# Patient Record
Sex: Male | Born: 1950 | Race: White | Hispanic: No | Marital: Married | Smoking: Former smoker
Health system: Southern US, Community
[De-identification: ages and names within clinical notes are randomized; demographics above are authoritative.]

## PROBLEM LIST (undated history)

## (undated) DIAGNOSIS — N200 Calculus of kidney: Secondary | ICD-10-CM

## (undated) DIAGNOSIS — M1990 Unspecified osteoarthritis, unspecified site: Secondary | ICD-10-CM

## (undated) DIAGNOSIS — K5792 Diverticulitis of intestine, part unspecified, without perforation or abscess without bleeding: Secondary | ICD-10-CM

## (undated) HISTORY — PX: BACK SURGERY: SHX140

## (undated) HISTORY — PX: JOINT REPLACEMENT: SHX530

---

## 1987-10-29 HISTORY — PX: CHOLECYSTECTOMY: SHX55

## 2001-03-04 ENCOUNTER — Encounter (INDEPENDENT_AMBULATORY_CARE_PROVIDER_SITE_OTHER): Payer: Self-pay | Admitting: *Deleted

## 2001-03-04 ENCOUNTER — Ambulatory Visit (HOSPITAL_COMMUNITY): Admission: RE | Admit: 2001-03-04 | Discharge: 2001-03-04 | Payer: Self-pay | Admitting: Gastroenterology

## 2001-04-29 ENCOUNTER — Emergency Department (HOSPITAL_COMMUNITY): Admission: EM | Admit: 2001-04-29 | Discharge: 2001-04-29 | Payer: Self-pay | Admitting: Emergency Medicine

## 2001-04-29 ENCOUNTER — Encounter: Payer: Self-pay | Admitting: Emergency Medicine

## 2001-05-08 ENCOUNTER — Other Ambulatory Visit: Admission: RE | Admit: 2001-05-08 | Discharge: 2001-05-08 | Payer: Self-pay | Admitting: Family Medicine

## 2001-10-20 ENCOUNTER — Encounter: Admission: RE | Admit: 2001-10-20 | Discharge: 2001-10-20 | Payer: Self-pay | Admitting: Orthopedic Surgery

## 2001-10-20 ENCOUNTER — Encounter: Payer: Self-pay | Admitting: Orthopedic Surgery

## 2001-11-23 ENCOUNTER — Encounter: Payer: Self-pay | Admitting: Orthopedic Surgery

## 2001-11-30 ENCOUNTER — Inpatient Hospital Stay (HOSPITAL_COMMUNITY): Admission: RE | Admit: 2001-11-30 | Discharge: 2001-12-06 | Payer: Self-pay | Admitting: Orthopedic Surgery

## 2001-11-30 ENCOUNTER — Encounter: Payer: Self-pay | Admitting: Orthopedic Surgery

## 2004-04-06 ENCOUNTER — Ambulatory Visit (HOSPITAL_COMMUNITY): Admission: RE | Admit: 2004-04-06 | Discharge: 2004-04-06 | Payer: Self-pay | Admitting: *Deleted

## 2004-04-06 ENCOUNTER — Encounter (INDEPENDENT_AMBULATORY_CARE_PROVIDER_SITE_OTHER): Payer: Self-pay | Admitting: *Deleted

## 2010-01-11 ENCOUNTER — Encounter: Admission: RE | Admit: 2010-01-11 | Discharge: 2010-01-11 | Payer: Self-pay | Admitting: Family Medicine

## 2011-03-15 NOTE — Discharge Summary (Signed)
[facility]  Patient:    Roy Becker, Roy Becker Visit Number: [id number] MRN: [id number]          Service Type: SUR Location: [id number] Attending Physician:  Loanne Drilling Dictated by:   Ottie Glazier Wynona Neat, P.A.-C. Admit Date:  11/30/2001 Discharge Date: 12/06/2001                             Discharge Summary  ADMITTING DIAGNOSES: 1. Status post right total hip arthroplasty, now with loosening components. 2. Depression. 3. Psoriasis. 4. History of kidney stone. 5. History of alcoholism with cessation in 1994. 6. History of benign essential tremor.  DISCHARGE DIAGNOSES: 1. Status post right total hip arthroplasty, now with loosening components,    improved. 2. Depression. 3. Psoriasis. 4. History of kidney stone. 5. History of alcoholism with cessation in 1994. 6. History of benign essential tremor.  PROCEDURE:  Right total hip revision per Ollen Gross, M.D., with Georges Lynch. Darrelyn Hillock, M.D., assisting.  CONSULTATIONS:  None.  HISTORY OF PRESENT ILLNESS:  Mr. Metayer is a 60 year old white male who was seen and evaluated at [facility] for ongoing right hip pain for approximately six months.  The patient is status post right total hip arthroplasty in 1989 with subsequent revision in 1997.  Since that time the patient had no problems; however, for the past six months the patient had complained of persistent, ongoing pain.  Diagnostic studies revealed that the patient had a loosening of the right hip component.  Due to these findings, a decision was made to proceed with surgical intervention per Dr. Ollen Gross. The risks and benefits have been discussed with the patient in great detail prior to entering the operating suite.  LABORATORY DATA:  X-rays showed loosening of the prosthesis.  Bone scan was performed and showed loosening of the femoral prosthesis as well.  HOSPITAL COURSE:  On November 29, 2001, the patient  underwent a right total hip revision by Dr. Lequita Halt with Dr. Darrelyn Hillock assisting.  Please see operative note for details.  Postoperative day #1 the patient was awake, he was alert.  He was in moderate pain.  He was utilizing a PCA.  He also complained of a sore throat postoperatively.  His vital signs were stable, he was afebrile.  His H&H was 13.1 and 36.8, his potassium was 5.2.  The following day patient was resting in bed stating that he felt better, his pain was moderately controlled, and that his throat was feeling better after the advent of Cepacol.  The patient was concerned about his Foley being removed too early, as he had had problems in the past postoperatively with urinary retention after the Foley was discontinued.  His vital signs were stable, he was afebrile.  His H&H was 10.9 and 30.4.  PT and INR were 30.3 and 4.1.  Right hip dressings were removed, and the incision was clean, dry, and intact during its removal without complications.  The incision was without erythema or discharge.  The patients motor and neurovascular functions were intact. Physical therapy will be initiated today.  He will be out of bed into a chair and on touchdown weightbearing.  Flomax was initiated at 0.4 mg p.o. q.d., and he will have his Foley discontinued later on today with orders to I&O catheterize x3 if needed with reinsertion of the Foley after this.  It was noted on December 02, 2001, OT was attempted; however, the patient  declined, saying he was too tired and would prefer to wait until tomorrow.  His anticoagulation therapy was monitored closely by pharmacy; however, on December 03, 2001, there was a large jump in the INR to 7.1; therefore, the Coumadin was withheld until an appropriate INR was obtained.  Examination of the right hip revealed that there was mild serosanguineous drainage in the distal incision, otherwise no signs or symptoms of infection.  He had 2+ dorsalis pedis pulses, and his  motor and sensory functions were intact.  The patient was placed on vitamin K as well.  On December 04, 2001, postoperative day #4, the patient was seen up in bed watching TV, had just finished eating breakfast.  Unfortunately, he stated he had to have his catheter reinserted. Otherwise, he felt good and had no other complaints.  His PT and INR were 22 and 2.4 this day.  Vital signs were stable, he was afebrile.  Examination of the right hip showed that there was still a mild amount of serosanguineous discharge noted on the dressing.   His _____ was intact, skin was intact. Motor and neurovascular functions were intact.  A decision made that if patient was unable to void during his hospital day without being catheterized, that we would initiate bladder training.  He had an I&O catheter for use until this resolved, as well as follow-up with urology.  The patient did continue Flomax. The following day the patient said that he was able to urinate on his own.  He had felt tired and fatigued and states that he could "feel his heart beating fast," otherwise no complaints.  No nausea or vomiting, fevers, or chills.  Blood pressure was 122/58, pulse was 106, respirations 16.  He was afebrile.  His PT and INR were 22.4 and 2.4.  His H&H was noted to be 8.8 and 25.  His admit H&H were 14.3 and 40.8.  Due to decrease of greater than 3 in his hemoglobin as well as the symptomatology, the decision made to transfuse the patient two units.  The following day the patient felt better, had more energy.  H&H was 10.5 and 30.5.  His vital signs were stable, he was afebrile. The incision was clean, dry, and intact.  Due to his improved condition, the decision was made to discharge him home.  FINAL DIAGNOSES: 1. Status post right total hip revision. 2. Postoperative anemia, resolved. 3. Urinary retention, resolved.  CONDITION ON DISCHARGE:  Improved.  DISCHARGE INSTRUCTIONS:  Diet is regular.  Activity:  He  is to keep his dressings clean, dry, and intact and change daily.  Touchdown weightbearing. Move feet and toes frequently.  [facility] will be following  the patient postoperatively for his physical therapy needs as well as his anticoagulation therapy.  DISCHARGE MEDICATIONS: 1. Percocet 5 mg one to two p.o. q.4-6h. p.r.n. pain, #40. 2. Robaxin 500 mg one p.o. q.6-8h. p.r.n. spasm, #40. 3. Flomax 0.4 mg one p.o. q.d., #10 of those.  FOLLOW-UP:  He is to follow up with Dr. Lequita Halt in 17 days and call [phone] for appointment, questions, or concerns. Dictated by:   Ottie Glazier. Wynona Neat, P.A.-C. Attending Physician:  Loanne Drilling DD:  01/06/02 TD:  01/08/02 Job: [id number]

## 2011-03-15 NOTE — Procedures (Signed)
[place]  Patient:    Roy Becker, Roy Becker.                   MRN: [id number] Proc. Date: 03/04/01 Attending:  Verlin Grills, M.D. CC:         Bing Neighbors. Tenny Craw, M.D.   Procedure Report  DATE OF BIRTH:  12-31-1950  REFERRING PHYSICIAN:  Dr. Judithann Sauger.  PROCEDURE PERFORMED:  Colonoscopy.  ENDOSCOPIST:  Verlin Grills, M.D.  INDICATIONS FOR PROCEDURE:  The patient is a 60 year old male referred for diagnostic colonoscopy to evaluate hemoccult positive stool.  Mr. Tukes was treated for possible diverticulitis December 20, 2000.  His stool was hemoccult positive by exam.  After completing a course of antibiotics, his pain resolved.  I discussed with the patient the complications associated with colonoscopy and polypectomy including intestinal bleeding and intestinal perforation.  The patient has signed the operative permit.  ALLERGIES:  IODINE.  PAST MEDICAL HISTORY:  Remote back surgery.  Bilateral hip replacement surgery.  Remote cholecystectomy.  Gastroesophageal reflux disease.  Recently treated acute diverticulitis.  PREMEDICATION:  Fentanyl 50 mcg, Versed 10 mg.  ENDOSCOPE:  Olympus pediatric video colonoscope.  DESCRIPTION OF PROCEDURE:  After obtaining informed consent, the patient was placed in the left lateral decubitus position.  I administered intravenous fentanyl and intravenous Versed to achieve conscious sedation for the procedure.  The patients blood pressure, oxygen saturation and cardiac rhythm were monitored throughout the procedure and documented in the medical record.  Anal inspection was normal.  Digital rectal examination revealed a non-nodular prostate.  The Olympus pediatric video colonoscope was then introduced into the rectum and under direct vision, advanced to the cecum.  Colonic preparation for the exam today was excellent.  Rectum:  Normal.  Sigmoid colon and descending colon:  At  approximately 50 cm from the anal verge a 0.5 mm sessile polyp was removed with the cold biopsy forceps.  I did not detect colonic diverticula and there was no endoscopic evidence for the presence of acute diverticulitis or inflammatory bowel disease.  Splenic flexure:  Normal.  Transverse colon:  Normal.  Hepatic flexure:  Normal.  Ascending colon:  Normal.  Cecum and ileocecal valve:  Normal.  ASSESSMENT:  Normal proctocolonoscopy to the cecum except for the removal of a 0.5 mm sessile polyp from the descending colon at approximately 50 cm from the anal verge.  RECOMMENDATIONS:  If the descending colon polyp returns adenomatous pathologically, Mr. Bunda should undergo a repeat colonoscopy in five years. If the descending colon polyp in non-neoplastic, Mr. Pembleton should undergo a repeat colonoscopy in 10 years. DD:  03/04/01 TD:  03/04/01 Job: [id number]

## 2011-03-15 NOTE — H&P (Signed)
[facility]  Patient:    Roy Becker, Roy Becker. Visit Number: [id number] MRN: [id number]          Service Type: Attending:  Ollen Gross, M.D. Dictated by:   Dorie Rank, P.A. Adm. Date:  11/30/01   CC:         C.A. Tenny Craw, M.D., [facility]   History and Physical  DATE OF BIRTH:  1951-04-17  CHIEF COMPLAINT:  Right hip pain.  HISTORY OF PRESENT ILLNESS:  Roy Becker is a pleasant 60 year old male, who has had ongoing right hip pain for about six months now.  He had a right total knee arthroplasty originally in 1989 with a subsequent revision in 1997, and has had no problems with it since the past six months.  X-rays and a bone scan revealed increased activity to the proximal and distal portions of the right femur.  It was felt due to his ongoing pain and diagnostic studies, he would need a right total hip arthroplasty revision.  Risks and benefits as well as procedure were discussed with the patient.  He agreed to proceed.  He obtained medical clearance from his family medical doctor, Dr. Tenny Craw.  MEDICATIONS: 1. Temazepam 15 mg one p.o. q.h.s. p.r.n. 2. Metoprolol 50 mg one p.o. b.i.d. 3. Wellbutrin SR 150 mg 1-2 p.o. q.d.  ALLERGIES:  EGGS causes facial edema.  RADIOGRAPHIC IV CONTRAST causes anaphylaxis.  PAST MEDICAL HISTORY: 1. Significant for osteoarthritis. 2. History of alcoholism.  He quit drinking in 1994 and has had no trouble    since then. 3. He has a history of kidney stones. 4. He has history of depression related to his sons recent death. 5. He had a right forearm fracture in 1994. 6. History of psoriasis. 7. History of benign essential tremor which he uses metoprolol for.  FAMILY HISTORY:  Son deceased with leukemia.  Mother living, 34, history of chronic leukemia.  Father living, age 64, history of cardiovascular disease.  SURGICAL HISTORY: 1. In 1989 and 1990 he had bilateral total hip  arthroplasty.  He had    subsequent revisions in 1997 to both hips. 2. Cholecystectomy in 1989. 3. Lumbar laminectomy in 1990.  SOCIAL HISTORY:  The patient is married.  He quit drinking in 1994.  He quit smoking in 1997.  REVIEW OF SYMPTOMS:  GENERAL:  No fevers, chills, night sweats, or bleeding tendencies.  PULMONARY:  No shortness of breath, productive cough, or hemoptysis.  CARDIOVASCULAR:  No chest pain, angina, or orthopnea.  ENDOCRINE: No history of diabetes mellitus, hypo or hyperthyroidism.  GENITOURINARY:  No dysuria, hematuria, or discharge.  GASTROINTESTINAL:  No nausea, vomiting, diarrhea, melena, or constipation.  NEUROLOGIC:  No seizures, headaches, or paralysis.  No diplopia.  PHYSICAL EXAMINATION:  GENERAL:  Alert and oriented 60 year old male, well-developed, well-nourished.  VITAL SIGNS:  Pulse 70, respirations 18, blood pressure 115/85.  HEENT:  Head is atraumatic, normocephalic.  Oropharynx is clear.  NECK:  Supple, negative for carotid bruits bilaterally.  Negative for cervical lymphadenopathy palpated on exam.  CHEST:  Lungs clear to auscultation bilaterally.  No wheezes, rhonchi, or rales.  BREASTS:  Not pertinent to present illness.  HEART:  S1, S2, negative for murmur, rub, or gallop.  Heart has a regular rate and rhythm.  ABDOMEN:  Round, nontender, positive bowel sounds.  GENITOURINARY:  Not pertinent to present illness.  EXTREMITIES:  Distal pulses are intact.  He walks with an antalgic abductor lurch.  He walks with  a cane.  He has pain with range of motion.  SKIN:  Intact.  He has psoriasis to his left lower extremity.  LABORATORY:  Preoperative labs and x-rays are pending.  Positive bone scan/x-rays revealed loosening of the femoral prosthesis.  IMPRESSION: 1. Loosening of total hip arthroplasty. 2. Depression. 3. Psoriasis. 4. History of kidney stone. 5. History of alcoholism, quit in 1994. 6. Benign essential tremor.  PLAN:   The patient is scheduled for a right total hip arthroplasty, _________ with Dr. Ollen Gross.Dictated by:   Dorie Rank, P.A. Attending:  Ollen Gross, M.D. DD:  11/23/01 TD:  11/23/01 Job: [id number]

## 2011-03-15 NOTE — Op Note (Signed)
[facility]  Patient:    Roy Becker Visit Number: [id number] MRN: [id number]          Service Type: Attending:  Ollen Gross, M.D. Dictated by:   Ollen Gross, M.D. Proc. Date: 11/30/01                             Operative Report  PREOPERATIVE DIAGNOSIS:  Loose right total hip arthroplasty.  POSTOPERATIVE DIAGNOSIS:  Loose right total hip arthroplasty.  PROCEDURE:  Right total hip arthroplasty revision.  SURGEON:  Ollen Gross, M.D.  ASSISTANT:  Georges Lynch. Darrelyn Hillock, M.D.  ANESTHESIA:  General.  ESTIMATED BLOOD LOSS:  1350  REPLACEMENT:  1 liter of cell saver.  DRAINS:  Hemovac x1.  COMPLICATIONS:  None.  CONDITION:  Stable to recovery.  BRIEF CLINICAL NOTE:  Roy Becker is a 60 year old male who had a revision right total hip done approximately 6-7 years ago and has had significant thigh pain for the past year. He also had evidence of asymmetric polyethylene wear with some lysis in his proximal femur. Bone scan demonstrated increased uptake and plain films demonstrated subsiding of the femoral stem. He presents now for femoral revision with acetabular liner revision secondary to loosening and osteolysis.  DESCRIPTION OF PROCEDURE:  After successful administration of general anesthetic, the patient was placed in the left lateral decubitus position with the right side up and held with the hip positioner. The right lower extremity was isolated from the perineum with plastic drapes and prepped and draped in the usual sterile fashion. A standard posterolateral incision was made, a long incision with a 10 blade through the subcutaneous tissue to the level of the fascia lata which was incised in line with the incision. Once we got down to the level of the pseudocapsule, a small incision was made and a tremendous amount of osteolytic material within the joint. This debris was all removed. The sciatic nerve had been palpated and protected prior  to this and removed enough debris so I would be able to dislocate the hip. There is definitely toggling of the stem consistent with a loose stem. The 26 mm femoral head is removed from the femoral neck. We obtained proximal exposure and disrupted the interface between prosthesis and bone proximally. I was then able to easily extract the stem. It was an osteonic Omniflex stem. The ______ hip on the distal part of the stem did not come out. Several attempts were made with the extraction instruments  to remove this but I could not remove it. We also reamed up to 18 mm proximal to this and it would not come. We thus subsequently made a cortical window after exposing the lateral cortex of the femur at the appropriate level. Windows removed and I was able to remove that tip. I prophylactically placed a cable distal to the window to prevent splitting once the stem was driven across. We then replaced the window and cable back.  I then reamed over a guidewire to a total of 18.5 mm in the femoral canal. The proximal reaming was performed to a 99F and the sleeve was machined to a large. The 99F large trial was placed with a 22 x 17 stem and first the calcar replace in the neck. The calcar replaced in the neck left them way too long instrument. The 36 plus 8 neck which left them at the appropriate length. We did a trial 26 head because a 26 liner just  for soft tissue tensioning and it was excellent. We removed all the trials and then removed the acetabular liner which had severe polyethylene wear. The metal shell was stable. We then placed a permanent 28 mm 10 degree liner into the osteonics cup with the high wall being at the 10 oclock position. We then did a trial again, this time with a 28 plus 0 head and there was some soft tissue laxity so we went to a 28 plus 6 head and there was great stability with full extension and full external rotation. 70 degrees flexion and 40 degrees adduction, 90  degrees internal rotation and 90 degrees flexion, and 70 degrees internal rotation. Took all trials out. The permanent 26F large sleeve was impacted into the proximal femur and then 22 x 17 S-ROM long stem with a 36 plus 8 neck is impacted. The anteversion ______ was approximately 20-25 degrees. The permanent 28 plus 6 head was placed, reduction performed and there was great stability throughout. We checked through the previous cortical window and a stem went across that easily with no split distal to it. I took a lateral x-ray which showed that the stem is in the femoral canal and that the clothespin of the stem had closed down consistent with a very tight fit. At this point, all the wounds had copiously irrigated with antibiotic solution and the fascia over the vastus lateralis closed with a running #1 Vicryl, fascia lata closed over a Hemovac drain with interrupted #1 Vicryl, subcu closed with #1 and 2-0 Vicryl and skin with staples. The skin was cleaned and dried and Steri-Strips and a bulky sterile dressing applied. The patient was awakened and transported to recovery in stable condition. Dictated by:   Ollen Gross, M.D. Attending:  Ollen Gross, M.D. DD:  11/30/01 TD:  11/30/01 Job: [id number]

## 2015-03-25 ENCOUNTER — Emergency Department (HOSPITAL_COMMUNITY): Payer: Federal, State, Local not specified - PPO

## 2015-03-25 ENCOUNTER — Encounter (HOSPITAL_COMMUNITY): Payer: Self-pay | Admitting: Emergency Medicine

## 2015-03-25 ENCOUNTER — Emergency Department (HOSPITAL_COMMUNITY)
Admission: EM | Admit: 2015-03-25 | Discharge: 2015-03-25 | Disposition: A | Discharge status: Home / Self Care | Payer: Federal, State, Local not specified - PPO | Attending: Emergency Medicine | Admitting: Emergency Medicine

## 2015-03-25 DIAGNOSIS — N200 Calculus of kidney: Secondary | ICD-10-CM | POA: Insufficient documentation

## 2015-03-25 DIAGNOSIS — Z8719 Personal history of other diseases of the digestive system: Secondary | ICD-10-CM | POA: Diagnosis not present

## 2015-03-25 DIAGNOSIS — Z9049 Acquired absence of other specified parts of digestive tract: Secondary | ICD-10-CM | POA: Diagnosis not present

## 2015-03-25 DIAGNOSIS — Z79899 Other long term (current) drug therapy: Secondary | ICD-10-CM | POA: Diagnosis not present

## 2015-03-25 DIAGNOSIS — R109 Unspecified abdominal pain: Secondary | ICD-10-CM

## 2015-03-25 DIAGNOSIS — R1031 Right lower quadrant pain: Secondary | ICD-10-CM | POA: Diagnosis present

## 2015-03-25 HISTORY — DX: Diverticulitis of intestine, part unspecified, without perforation or abscess without bleeding: K57.92

## 2015-03-25 HISTORY — DX: Calculus of kidney: N20.0

## 2015-03-25 LAB — URINE MICROSCOPIC-ADD ON

## 2015-03-25 LAB — COMPREHENSIVE METABOLIC PANEL
ALK PHOS: 93 U/L (ref 38–126)
ALT: 27 U/L (ref 17–63)
AST: 23 U/L (ref 15–41)
Albumin: 4.3 g/dL (ref 3.5–5.0)
Anion gap: 9 (ref 5–15)
BUN: 17 mg/dL (ref 6–20)
CALCIUM: 8.7 mg/dL — AB (ref 8.9–10.3)
CHLORIDE: 104 mmol/L (ref 101–111)
CO2: 26 mmol/L (ref 22–32)
CREATININE: 1.06 mg/dL (ref 0.61–1.24)
GFR calc Af Amer: >60 mL/min (ref >60)
GFR calc non Af Amer: >60 mL/min (ref >60)
Glucose, Bld: 199 mg/dL — ABNORMAL HIGH (ref 65–99)
Potassium: 3.9 mmol/L (ref 3.5–5.1)
SODIUM: 139 mmol/L (ref 135–145)
TOTAL PROTEIN: 7.4 g/dL (ref 6.5–8.1)
Total Bilirubin: 1 mg/dL (ref 0.3–1.2)

## 2015-03-25 LAB — URINALYSIS, ROUTINE W REFLEX MICROSCOPIC
BILIRUBIN URINE: NEGATIVE
GLUCOSE, UA: 500 mg/dL — AB
Ketones, ur: NEGATIVE mg/dL
Leukocytes, UA: NEGATIVE
NITRITE: NEGATIVE
PROTEIN: NEGATIVE mg/dL
SPECIFIC GRAVITY, URINE: 1.02 (ref 1.005–1.030)
UROBILINOGEN UA: 0.2 mg/dL (ref 0.0–1.0)
pH: 7 (ref 5.0–8.0)

## 2015-03-25 LAB — CBC
HEMATOCRIT: 42.9 % (ref 39.0–52.0)
Hemoglobin: 15 g/dL (ref 13.0–17.0)
MCH: 31.3 pg (ref 26.0–34.0)
MCHC: 35 g/dL (ref 30.0–36.0)
MCV: 89.4 fL (ref 78.0–100.0)
PLATELETS: 169 10*3/uL (ref 150–400)
RBC: 4.8 MIL/uL (ref 4.22–5.81)
RDW: 12.5 % (ref 11.5–15.5)
WBC: 10.7 10*3/uL — AB (ref 4.0–10.5)

## 2015-03-25 LAB — LIPASE, BLOOD: Lipase: 22 U/L (ref 22–51)

## 2015-03-25 MED ORDER — ONDANSETRON HCL 8 MG PO TABS: 4.0000 mg | ORAL_TABLET | Freq: Four times a day (QID) | ORAL | Status: DC

## 2015-03-25 MED ORDER — SODIUM CHLORIDE 0.9 % IV SOLN
Freq: Once | INTRAVENOUS | Status: AC
  Administered 2015-03-25: 17:00:00 via INTRAVENOUS

## 2015-03-25 MED ORDER — ONDANSETRON HCL 4 MG/2ML IJ SOLN
4.0000 mg | INTRAMUSCULAR | Status: AC
  Administered 2015-03-25: 4 mg via INTRAVENOUS
  Filled 2015-03-25: qty 2

## 2015-03-25 MED ORDER — OXYCODONE-ACETAMINOPHEN 5-325 MG PO TABS: 1.0000 | ORAL_TABLET | Freq: Four times a day (QID) | ORAL | Status: DC | PRN

## 2015-03-25 MED ORDER — TRAMADOL HCL 50 MG PO TABS
50.0000 mg | ORAL_TABLET | Freq: Once | ORAL | Status: AC
  Administered 2015-03-25: 50 mg via ORAL
  Filled 2015-03-25: qty 1

## 2015-03-25 MED ORDER — MORPHINE SULFATE 2 MG/ML IJ SOLN
2.0000 mg | Freq: Once | INTRAMUSCULAR | Status: AC
  Administered 2015-03-25: 2 mg via INTRAVENOUS
  Filled 2015-03-25: qty 1

## 2015-03-25 MED ORDER — MORPHINE SULFATE 4 MG/ML IJ SOLN
2.0000 mg | INTRAMUSCULAR | Status: AC | PRN
  Administered 2015-03-25 (×2): 2 mg via INTRAVENOUS
  Filled 2015-03-25 (×2): qty 1

## 2015-03-25 MED ORDER — TAMSULOSIN HCL 0.4 MG PO CAPS: 0.4000 mg | ORAL_CAPSULE | Freq: Every day | ORAL | Status: DC

## 2015-03-25 NOTE — Discharge Instructions (Signed)

## 2015-03-25 NOTE — ED Notes (Signed)
Patient transported to X-ray 

## 2015-03-25 NOTE — ED Notes (Signed)
Pt c/o RLQ/groin pain that radiates into his "whole back."  Pt has hx of kidney stones and diverticulitis but sts that this pain feels different than both. Pt sts that since this morning he has only been able to urinate a few drops which is abnormal for him. Pt c/o nausea with one episode of vomiting. A&Ox4 and ambulatory.

## 2015-03-25 NOTE — ED Notes (Signed)
Nurse starting IV 

## 2015-03-25 NOTE — ED Provider Notes (Signed)
CSN: [id number]     Arrival date & time 03/25/15  1450 History   First MD Initiated Contact with Patient 03/25/15 1511     Chief Complaint  Patient presents with  . Abdominal Pain  . Back Pain     (Consider location/radiation/quality/duration/timing/severity/associated sxs/prior Treatment) HPI   Roy Becker is a 64 year-old male with past medical history of cholecystectomy, ETOH abuse, diverticulitis and psoriasis, who presents to the [facility] emergency department this afternoon for 1 day of achy abdominal pain, located in his right lower abdomen, which radiates to his bilateral lower back and to right scrotum.  It began last night and intermittantly increased and subsided.  The pain woke him up at 3 am, was severity rated 8/10 and he experienced associated sweating, "disorientation' and nausea. He attempted to make himself vomit to relieve the nausea, but was unable to.  He took 2 amoxicillin pills of unknown strength and a restoril -  capsul, and was able to doze back off for an hour.  From 4 am on he sat in bed, unable to get back to sleep, but the pain eased off enough that he did go to work this morning, although the pain never fully went away.  He can get some relief from the pain with positional changes laying in bed, or by completely relaxing his abdomen.  His abdomen is more distended than usual.   He previously had a kidney stone, 8 years ago, and he said that pain was sharp and this does not feel the same.  He has chronic loose bowels and last had a bowel movement this morning at work at approximately 9am, without blood or color change, and he feels the urge to have a BM but cannot.   He says during a normal day he drinks one - 20 oz mt. Dew, and does not urinate very much.  He has made very little urine since last night, and denies any burning, urgency or frequency.   Past Medical History  Diagnosis Date  . Kidney stone   . Diverticulitis    Past Surgical History  Procedure  Laterality Date  . Joint replacement    . Cholecystectomy     No family history on file. History  Substance Use Topics  . Smoking status: Not on file  . Smokeless tobacco: Not on file  . Alcohol Use: No    Review of Systems  Constitutional: Negative for fever, activity change, appetite change, fatigue and unexpected weight change.  HENT: Negative.   Eyes: Negative.   Respiratory: Negative.   Cardiovascular: Negative.   Musculoskeletal: Positive for back pain.  Skin: Negative.   Neurological: Negative.       Allergies  Contrast media and Eggs or egg-derived products  Home Medications   Prior to Admission medications   Medication Sig Start Date End Date Taking? Authorizing Provider  amitriptyline (ELAVIL) 10 MG tablet Take 10 mg by mouth daily as needed for sleep (stomach irritation).  03/10/15  Yes Historical Provider, MD  buPROPion (WELLBUTRIN XL) 300 MG 24 hr tablet Take 300 mg by mouth daily. 03/05/15  Yes Historical Provider, MD  carvedilol (COREG) 25 MG tablet Take 25 mg by mouth 2 (two) times daily with a meal.   Yes Historical Provider, MD  clobetasol cream (TEMOVATE) 0.05 % Apply 1 application topically 2 (two) times daily as needed (psoriasis).   Yes Historical Provider, MD  COSENTYX SENSOREADY PEN 150 MG/ML SOAJ Take 300 mg by mouth every 28 (twenty-eight) days.  03/21/15  Yes Historical Provider, MD  folic acid (FOLVITE) 1 MG tablet Take 1 mg by mouth daily. 03/13/15  Yes Historical Provider, MD  temazepam (RESTORIL) 15 MG capsule Take 15-30 mg by mouth at bedtime as needed. 03/14/15  Yes Historical Provider, MD  ondansetron (ZOFRAN) 8 MG tablet Take 0.5 tablets (4 mg total) by mouth every 6 (six) hours. 03/25/15   Danelle Berry, PA-C  oxyCODONE-acetaminophen (PERCOCET/ROXICET) 5-325 MG per tablet Take 1-2 tablets by mouth every 6 (six) hours as needed for severe pain. 03/25/15   Danelle Berry, PA-C  tamsulosin (FLOMAX) 0.4 MG CAPS capsule Take 1 capsule (0.4 mg total) by mouth  daily. 03/25/15   Danelle Berry, PA-C   BP 160/67 mmHg  Pulse 80  Temp(Src) 98.1 F (36.7 C) (Oral)  Resp 19  Ht  (1.778 m)  Wt 220 lb (99.791 kg)  BMI 31.57 kg/m2  SpO2 94% Physical Exam  Constitutional: He is oriented to person, place, and time. He appears well-developed and well-nourished.  HENT:  Head: Normocephalic and atraumatic.  Eyes: Conjunctivae and EOM are normal. Pupils are equal, round, and reactive to light. No scleral icterus.  Cardiovascular: Normal rate, regular rhythm and normal pulses.  Exam reveals no gallop and no friction rub.   No murmur heard. Pulmonary/Chest: Effort normal and breath sounds normal. No accessory muscle usage. No tachypnea. No respiratory distress. He has no wheezes. He has no rales. He exhibits no tenderness.  Abdominal: Soft. Bowel sounds are normal. He exhibits shifting dullness. He exhibits no distension, no pulsatile liver, no abdominal bruit, no ascites, no pulsatile midline mass and no mass. There is no hepatomegaly. There is tenderness in the right lower quadrant. There is no rigidity, no rebound, no guarding, no CVA tenderness, no tenderness at McBurney's point and negative Murphy's sign. No hernia. Hernia confirmed negative in the ventral area, confirmed negative in the right inguinal area and confirmed negative in the left inguinal area.  Genitourinary: Testes normal and penis normal. Right testis shows no mass, no swelling and no tenderness. Left testis shows no mass, no swelling and no tenderness. No penile erythema or penile tenderness. No discharge found.  Lymphadenopathy:       Right: No inguinal adenopathy present.       Left: No inguinal adenopathy present.  Neurological: He is alert and oriented to person, place, and time.  Skin: Skin is warm and dry. No rash noted. He is not diaphoretic. No erythema. No pallor.    ED Course  Procedures (including critical care time) Labs Review Labs Reviewed  URINALYSIS, ROUTINE W REFLEX  MICROSCOPIC (NOT AT [facility]) - Abnormal; Notable for the following:    Glucose, UA 500 (*)    Hgb urine dipstick MODERATE (*)    All other components within normal limits  COMPREHENSIVE METABOLIC PANEL - Abnormal; Notable for the following:    Glucose, Bld 199 (*)    Calcium 8.7 (*)    All other components within normal limits  CBC - Abnormal; Notable for the following:    WBC 10.7 (*)    All other components within normal limits  LIPASE, BLOOD  URINE MICROSCOPIC-ADD ON  HEMOGLOBIN A1C    Imaging Review Ct Abdomen Pelvis Wo Contrast  03/25/2015   CLINICAL DATA:  Right lower quadrant pain  EXAM: CT ABDOMEN AND PELVIS WITHOUT CONTRAST  TECHNIQUE: Multidetector CT imaging of the abdomen and pelvis was performed following the standard protocol without IV contrast.  COMPARISON:  01/11/2010  FINDINGS:  Lung bases are free of acute infiltrate or sizable effusion. The gallbladder has been surgically removed. The liver, spleen, adrenal glands and pancreas are within normal limits. The kidneys are well visualized bilaterally and the left kidney is well visualized and within normal limits. The right kidney demonstrates some scattered small nonobstructing calculi. The largest of these is not noted in the lower pole measuring 5 mm. Mild fullness of the collecting system and right ureter is noted secondary to a 6 mm stone in the mid right ureter. The more distal right ureter is within normal limits. The bladder is within normal limits. Mild diverticular change is noted without diverticulitis. Postsurgical changes in the hip this are noted bilaterally. The appendix is within normal limits.  IMPRESSION: Nonobstructing right renal calculi as well as a right mid ureteral stone with obstructive changes. No other focal abnormality is seen.   Electronically Signed   By: Alcide CleverMark  Lukens M.D.   On: 03/25/2015 18:58   Dg Chest 2 View  03/25/2015   CLINICAL DATA:  Abdominal/back pain  EXAM: CHEST  2 VIEW  COMPARISON:  None.   FINDINGS: Lungs are clear.  No pleural effusion or pneumothorax.  The heart is normal in size.  Visualized osseous structures are within normal limits.  IMPRESSION: Normal chest radiographs.   Electronically Signed   By: Charline BillsSriyesh  Krishnan M.D.   On: 03/25/2015 16:29   Dg Abd 1 View  03/25/2015   CLINICAL DATA:  Abdominal pain x1 day, nausea  EXAM: ABDOMEN - 1 VIEW  COMPARISON:  None.  FINDINGS: Nonobstructive bowel gas pattern.  Cholecystectomy clips.  Degenerative changes the lower lumbar spine.  Visualized bony pelvis appears intact.  Bilateral hip arthroplasties.  IMPRESSION: Unremarkable abdominal radiograph.   Electronically Signed   By: Charline BillsSriyesh  Krishnan M.D.   On: 03/25/2015 16:29     EKG Interpretation None      MDM   Final diagnoses:  Abdominal pain, acute  Nephrolithiasis   Pt had achy abdominal pain Imaging found multiple kidney stones.   Pt was notified of elevated blood sugars and advised to follow up with PCP for fasting glucose testing. Prescribed medications were reviewed, tamsulosin SE of orthostatic hypotension was explained and pt was advised to take at night when going to bed to avoid SE.   PO trial successful, one dose of oral tramadol was ordered and pt vitals were reviewed and within normal limits.  Filed Vitals:   03/25/15 1940  BP: 160/67  Pulse: 80  Temp: 98.1 F (36.7 C)  Resp: 19   He was given a strainer, and follow up instruction, return precautions, and was discharged home   Danelle BerryLeisa Tomio Kirk, PA-C 03/27/15 0325  Roy DibblesJon Knapp, MD 03/29/15 1441

## 2015-03-25 NOTE — ED Notes (Signed)
Gave patient a few strainers and a urinal cup for the kidney stones.

## 2015-03-25 NOTE — ED Notes (Signed)
Patient was given water and did not have any difficulty drinking the water. Patient did not feel nausea's and did not vomit.

## 2015-03-28 LAB — HEMOGLOBIN A1C
HEMOGLOBIN A1C: 6.5 % — AB (ref 4.8–5.6)
Mean Plasma Glucose: 140 mg/dL

## 2015-04-06 ENCOUNTER — Telehealth: Payer: Self-pay | Admitting: Hematology

## 2015-07-24 ENCOUNTER — Other Ambulatory Visit (HOSPITAL_COMMUNITY): Payer: Self-pay | Admitting: Orthopedic Surgery

## 2015-07-24 DIAGNOSIS — Z96642 Presence of left artificial hip joint: Secondary | ICD-10-CM

## 2015-08-04 ENCOUNTER — Encounter (HOSPITAL_COMMUNITY)
Admission: RE | Admit: 2015-08-04 | Discharge: 2015-08-04 | Disposition: A | Discharge status: Home / Self Care | Payer: Federal, State, Local not specified - PPO | Source: Ambulatory Visit | Attending: Orthopedic Surgery | Admitting: Orthopedic Surgery

## 2015-08-04 ENCOUNTER — Ambulatory Visit (HOSPITAL_COMMUNITY)
Admission: RE | Admit: 2015-08-04 | Discharge: 2015-08-04 | Disposition: A | Discharge status: Home / Self Care | Payer: Federal, State, Local not specified - PPO | Source: Ambulatory Visit | Attending: Orthopedic Surgery | Admitting: Orthopedic Surgery

## 2015-08-04 DIAGNOSIS — M25552 Pain in left hip: Secondary | ICD-10-CM | POA: Diagnosis present

## 2015-08-04 DIAGNOSIS — Z96641 Presence of right artificial hip joint: Secondary | ICD-10-CM | POA: Diagnosis present

## 2015-08-04 DIAGNOSIS — Z96642 Presence of left artificial hip joint: Secondary | ICD-10-CM

## 2015-08-04 MED ORDER — TECHNETIUM TC 99M MEDRONATE IV KIT
27.5000 | PACK | Freq: Once | INTRAVENOUS | Status: AC | PRN
  Administered 2015-08-04: 27.5 via INTRAVENOUS

## 2015-12-04 ENCOUNTER — Ambulatory Visit: Payer: Self-pay | Admitting: Orthopedic Surgery

## 2015-12-04 NOTE — Progress Notes (Signed)
Preoperative surgical orders have been place into the Epic hospital system for [employer] on 12/04/2015, 9:22 AM  by Patrica Duel for surgery on 12-20-2015.  Preop Hip orders including IV Tylenol, and IV Decadron as long as there are no contraindications to the above medications. Avel Peace, PA-C

## 2015-12-07 ENCOUNTER — Ambulatory Visit: Payer: Self-pay | Admitting: Orthopedic Surgery
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
## 2015-12-11 NOTE — Patient Instructions (Addendum)
Roy Shells Hinda Glatter Jr.  12/11/2015   Your procedure is scheduled on: 12/20/2015    Report to [facility] Main  Entrance take [facility]  elevators to 3rd floor to  Short Stay Center at   1045 AM.  Call this number if you have problems the morning of surgery [phone]   Remember: ONLY 1 PERSON MAY GO WITH YOU TO SHORT STAY TO GET  READY MORNING OF YOUR SURGERY.  Do not eat food or drink liquids :After Midnight.     Take these medicines the morning of surgery with A SIP OF WATER: Wellbutrin, Carvedilol ( Coreg)                                You may not have any metal on your body including hair pins and              piercings  Do not wear jewelry,, lotions, powders or perfumes, deodorant                     Men may shave face and neck.   Do not bring valuables to the hospital. [facility] IS NOT             RESPONSIBLE   FOR VALUABLES.  Contacts, dentures or bridgework may not be worn into surgery.  Leave suitcase in the car. After surgery it may be brought to your room.       Special Instructions: coughing and deep breathing exercises, leg exercises               Please read over the following fact sheets you were given: _____________________________________________________________________             [facility] - Preparing for Surgery Before surgery, you can play an important role.  Because skin is not sterile, your skin needs to be as free of germs as possible.  You can reduce the number of germs on your skin by washing with CHG (chlorahexidine gluconate) soap before surgery.  CHG is an antiseptic cleaner which kills germs and bonds with the skin to continue killing germs even after washing. Please DO NOT use if you have an allergy to CHG or antibacterial soaps.  If your skin becomes reddened/irritated stop using the CHG and inform your nurse when you arrive at Short Stay. Do not shave (including legs and underarms) for at least 48 hours prior to the first  CHG shower.  You may shave your face/neck. Please follow these instructions carefully:  1.  Shower with CHG Soap the night before surgery and the  morning of Surgery.  2.  If you choose to wash your hair, wash your hair first as usual with your  normal  shampoo.  3.  After you shampoo, rinse your hair and body thoroughly to remove the  shampoo.                           4.  Use CHG as you would any other liquid soap.  You can apply chg directly  to the skin and wash                       Gently with a scrungie or clean washcloth.  5.  Apply the CHG Soap to  your body ONLY FROM THE NECK DOWN.   Do not use on face/ open                           Wound or open sores. Avoid contact with eyes, ears mouth and genitals (private parts).                       Wash face,  Genitals (private parts) with your normal soap.             6.  Wash thoroughly, paying special attention to the area where your surgery  will be performed.  7.  Thoroughly rinse your body with warm water from the neck down.  8.  DO NOT shower/wash with your normal soap after using and rinsing off  the CHG Soap.                9.  Pat yourself dry with a clean towel.            10.  Wear clean pajamas.            11.  Place clean sheets on your bed the night of your first shower and do not  sleep with pets. Day of Surgery : Do not apply any lotions/deodorants the morning of surgery.  Please wear clean clothes to the hospital/surgery center.  FAILURE TO FOLLOW THESE INSTRUCTIONS MAY RESULT IN THE CANCELLATION OF YOUR SURGERY PATIENT SIGNATURE_________________________________  NURSE SIGNATURE__________________________________  ________________________________________________________________________  WHAT IS A BLOOD TRANSFUSION? Blood Transfusion Information  A transfusion is the replacement of blood or some of its parts. Blood is made up of multiple cells which provide different functions.  Red blood cells carry oxygen and are used  for blood loss replacement.  White blood cells fight against infection.  Platelets control bleeding.  Plasma helps clot blood.  Other blood products are available for specialized needs, such as hemophilia or other clotting disorders. BEFORE THE TRANSFUSION  Who gives blood for transfusions?   Healthy volunteers who are fully evaluated to make sure their blood is safe. This is blood bank blood. Transfusion therapy is the safest it has ever been in the practice of medicine. Before blood is taken from a donor, a complete history is taken to make sure that person has no history of diseases nor engages in risky social behavior (examples are intravenous drug use or sexual activity with multiple partners). The donor's travel history is screened to minimize risk of transmitting infections, such as malaria. The donated blood is tested for signs of infectious diseases, such as HIV and hepatitis. The blood is then tested to be sure it is compatible with you in order to minimize the chance of a transfusion reaction. If you or a relative donates blood, this is often done in anticipation of surgery and is not appropriate for emergency situations. It takes many days to process the donated blood. RISKS AND COMPLICATIONS Although transfusion therapy is very safe and saves many lives, the main dangers of transfusion include:  1. Getting an infectious disease. 2. Developing a transfusion reaction. This is an allergic reaction to something in the blood you were given. Every precaution is taken to prevent this. The decision to have a blood transfusion has been considered carefully by your caregiver before blood is given. Blood is not given unless the benefits outweigh the risks. AFTER THE TRANSFUSION  Right after receiving a blood transfusion, you will usually  feel much better and more energetic. This is especially true if your red blood cells have gotten low (anemic). The transfusion raises the level of the red  blood cells which carry oxygen, and this usually causes an energy increase.  The nurse administering the transfusion will monitor you carefully for complications. HOME CARE INSTRUCTIONS  No special instructions are needed after a transfusion. You may find your energy is better. Speak with your caregiver about any limitations on activity for underlying diseases you may have. SEEK MEDICAL CARE IF:   Your condition is not improving after your transfusion.  You develop redness or irritation at the intravenous (IV) site. SEEK IMMEDIATE MEDICAL CARE IF:  Any of the following symptoms occur over the next 12 hours:  Shaking chills.  You have a temperature by mouth above 102 F (38.9 C), not controlled by medicine.  Chest, back, or muscle pain.  People around you feel you are not acting correctly or are confused.  Shortness of breath or difficulty breathing.  Dizziness and fainting.  You get a rash or develop hives.  You have a decrease in urine output.  Your urine turns a dark color or changes to pink, red, or brown. Any of the following symptoms occur over the next 10 days:  You have a temperature by mouth above 102 F (38.9 C), not controlled by medicine.  Shortness of breath.  Weakness after normal activity.  The white part of the eye turns yellow (jaundice).  You have a decrease in the amount of urine or are urinating less often.  Your urine turns a dark color or changes to pink, red, or brown. Document Released: 10/11/2000 Document Revised: 01/06/2012 Document Reviewed: 05/30/2008 Exit[employer] Patient Information 2014 [place], [place].  _______________________________________________________________________  Incentive Spirometer  An incentive spirometer is a tool that can help keep your lungs clear and active. This tool measures how well you are filling your lungs with each breath. Taking long deep breaths may help reverse or decrease the chance of developing breathing  (pulmonary) problems (especially infection) following:  A long period of time when you are unable to move or be active. BEFORE THE PROCEDURE   If the spirometer includes an indicator to show your best effort, your nurse or respiratory therapist will set it to a desired goal.  If possible, sit up straight or lean slightly forward. Try not to slouch.  Hold the incentive spirometer in an upright position. INSTRUCTIONS FOR USE  3. Sit on the edge of your bed if possible, or sit up as far as you can in bed or on a chair. 4. Hold the incentive spirometer in an upright position. 5. Breathe out normally. 6. Place the mouthpiece in your mouth and seal your lips tightly around it. 7. Breathe in slowly and as deeply as possible, raising the piston or the ball toward the top of the column. 8. Hold your breath for 3-5 seconds or for as long as possible. Allow the piston or ball to fall to the bottom of the column. 9. Remove the mouthpiece from your mouth and breathe out normally. 10. Rest for a few seconds and repeat Steps 1 through 7 at least 10 times every 1-2 hours when you are awake. Take your time and take a few normal breaths between deep breaths. 11. The spirometer may include an indicator to show your best effort. Use the indicator as a goal to work toward during each repetition. 12. After each set of 10 deep breaths, practice coughing to  be sure your lungs are clear. If you have an incision (the cut made at the time of surgery), support your incision when coughing by placing a pillow or rolled up towels firmly against it. Once you are able to get out of bed, walk around indoors and cough well. You may stop using the incentive spirometer when instructed by your caregiver.  RISKS AND COMPLICATIONS  Take your time so you do not get dizzy or light-headed.  If you are in pain, you may need to take or ask for pain medication before doing incentive spirometry. It is harder to take a deep breath if you  are having pain. AFTER USE  Rest and breathe slowly and easily.  It can be helpful to keep track of a log of your progress. Your caregiver can provide you with a simple table to help with this. If you are using the spirometer at home, follow these instructions: SEEK MEDICAL CARE IF:   You are having difficultly using the spirometer.  You have trouble using the spirometer as often as instructed.  Your pain medication is not giving enough relief while using the spirometer.  You develop fever of 100.5 F (38.1 C) or higher. SEEK IMMEDIATE MEDICAL CARE IF:   You cough up bloody sputum that had not been present before.  You develop fever of 102 F (38.9 C) or greater.  You develop worsening pain at or near the incision site. MAKE SURE YOU:   Understand these instructions.  Will watch your condition.  Will get help right away if you are not doing well or get worse. Document Released: 02/24/2007 Document Revised: 01/06/2012 Document Reviewed: 04/27/2007 [employer] Patient Information 2014 [place], [place].   ________________________________________________________________________

## 2015-12-12 ENCOUNTER — Encounter (HOSPITAL_COMMUNITY)
Admission: RE | Admit: 2015-12-12 | Discharge: 2015-12-12 | Disposition: A | Discharge status: Home / Self Care | Payer: Federal, State, Local not specified - PPO | Source: Ambulatory Visit | Attending: Orthopedic Surgery | Admitting: Orthopedic Surgery

## 2015-12-12 ENCOUNTER — Encounter (HOSPITAL_COMMUNITY): Payer: Self-pay

## 2015-12-12 DIAGNOSIS — Z0183 Encounter for blood typing: Secondary | ICD-10-CM | POA: Insufficient documentation

## 2015-12-12 DIAGNOSIS — T84031A Mechanical loosening of internal left hip prosthetic joint, initial encounter: Secondary | ICD-10-CM | POA: Diagnosis not present

## 2015-12-12 DIAGNOSIS — Y838 Other surgical procedures as the cause of abnormal reaction of the patient, or of later complication, without mention of misadventure at the time of the procedure: Secondary | ICD-10-CM | POA: Insufficient documentation

## 2015-12-12 DIAGNOSIS — Z01812 Encounter for preprocedural laboratory examination: Secondary | ICD-10-CM | POA: Diagnosis present

## 2015-12-12 HISTORY — DX: Unspecified osteoarthritis, unspecified site: M19.90

## 2015-12-12 LAB — CBC
HEMATOCRIT: 44.2 % (ref 39.0–52.0)
Hemoglobin: 15.6 g/dL (ref 13.0–17.0)
MCH: 32 pg (ref 26.0–34.0)
MCHC: 35.3 g/dL (ref 30.0–36.0)
MCV: 90.6 fL (ref 78.0–100.0)
Platelets: 164 10*3/uL (ref 150–400)
RBC: 4.88 MIL/uL (ref 4.22–5.81)
RDW: 12.4 % (ref 11.5–15.5)
WBC: 7.7 10*3/uL (ref 4.0–10.5)

## 2015-12-12 LAB — URINALYSIS, ROUTINE W REFLEX MICROSCOPIC
Bilirubin Urine: NEGATIVE
Glucose, UA: 250 mg/dL — AB
Hgb urine dipstick: NEGATIVE
Ketones, ur: NEGATIVE mg/dL
NITRITE: NEGATIVE
PH: 6.5 (ref 5.0–8.0)
Protein, ur: NEGATIVE mg/dL
SPECIFIC GRAVITY, URINE: 1.03 (ref 1.005–1.030)

## 2015-12-12 LAB — TYPE AND SCREEN
ABO/RH(D): O POS
ANTIBODY SCREEN: POSITIVE
DAT, IgG: NEGATIVE
PT AG Type: NEGATIVE

## 2015-12-12 LAB — APTT: APTT: 30 s (ref 24–37)

## 2015-12-12 LAB — COMPREHENSIVE METABOLIC PANEL
ALBUMIN: 4.5 g/dL (ref 3.5–5.0)
ALT: 26 U/L (ref 17–63)
ANION GAP: 7 (ref 5–15)
AST: 27 U/L (ref 15–41)
Alkaline Phosphatase: 104 U/L (ref 38–126)
BILIRUBIN TOTAL: 0.9 mg/dL (ref 0.3–1.2)
BUN: 13 mg/dL (ref 6–20)
CALCIUM: 9.1 mg/dL (ref 8.9–10.3)
CO2: 28 mmol/L (ref 22–32)
Chloride: 106 mmol/L (ref 101–111)
Creatinine, Ser: 0.89 mg/dL (ref 0.61–1.24)
Glucose, Bld: 209 mg/dL — ABNORMAL HIGH (ref 65–99)
POTASSIUM: 5 mmol/L (ref 3.5–5.1)
Sodium: 141 mmol/L (ref 135–145)
TOTAL PROTEIN: 7.3 g/dL (ref 6.5–8.1)

## 2015-12-12 LAB — URINE MICROSCOPIC-ADD ON

## 2015-12-12 LAB — SURGICAL PCR SCREEN
MRSA, PCR: NEGATIVE
STAPHYLOCOCCUS AUREUS: NEGATIVE

## 2015-12-12 LAB — PROTIME-INR
INR: 1.09 (ref 0.00–1.49)
PROTHROMBIN TIME: 14.3 s (ref 11.6–15.2)

## 2015-12-12 NOTE — Progress Notes (Signed)
CMP done 12/12/15 faxed via EPIC to Dr Lequita Halt.

## 2015-12-12 NOTE — Progress Notes (Signed)
Clearance- DR Tenny Craw- 08/19/15- chart CXR- 03/25/15- EPIC

## 2015-12-13 NOTE — Progress Notes (Signed)
U/A and micro done 12/12/15 faxed via EPIC to Dr Lequita Halt.

## 2015-12-20 ENCOUNTER — Inpatient Hospital Stay (HOSPITAL_COMMUNITY): Payer: Federal, State, Local not specified - PPO

## 2015-12-20 ENCOUNTER — Encounter (HOSPITAL_COMMUNITY)
Admission: RE | Disposition: A | Discharge status: Home Health | Payer: Self-pay | Source: Ambulatory Visit | Attending: Orthopedic Surgery

## 2015-12-20 ENCOUNTER — Encounter (HOSPITAL_COMMUNITY): Payer: Self-pay | Admitting: *Deleted

## 2015-12-20 ENCOUNTER — Inpatient Hospital Stay (HOSPITAL_COMMUNITY)
Admission: RE | Admit: 2015-12-20 | Discharge: 2015-12-22 | DRG: 468 | Disposition: A | Discharge status: Home Health | Payer: Federal, State, Local not specified - PPO | Source: Ambulatory Visit | Attending: Orthopedic Surgery | Admitting: Orthopedic Surgery

## 2015-12-20 ENCOUNTER — Inpatient Hospital Stay (HOSPITAL_COMMUNITY): Payer: Federal, State, Local not specified - PPO | Admitting: Anesthesiology

## 2015-12-20 DIAGNOSIS — M25552 Pain in left hip: Secondary | ICD-10-CM | POA: Diagnosis present

## 2015-12-20 DIAGNOSIS — T84091A Other mechanical complication of internal left hip prosthesis, initial encounter: Secondary | ICD-10-CM | POA: Diagnosis present

## 2015-12-20 DIAGNOSIS — Z01812 Encounter for preprocedural laboratory examination: Secondary | ICD-10-CM | POA: Diagnosis not present

## 2015-12-20 DIAGNOSIS — Z87891 Personal history of nicotine dependence: Secondary | ICD-10-CM | POA: Diagnosis not present

## 2015-12-20 DIAGNOSIS — Z87442 Personal history of urinary calculi: Secondary | ICD-10-CM | POA: Diagnosis not present

## 2015-12-20 DIAGNOSIS — Y792 Prosthetic and other implants, materials and accessory orthopedic devices associated with adverse incidents: Secondary | ICD-10-CM | POA: Diagnosis present

## 2015-12-20 DIAGNOSIS — Z96649 Presence of unspecified artificial hip joint: Secondary | ICD-10-CM

## 2015-12-20 DIAGNOSIS — T84018A Broken internal joint prosthesis, other site, initial encounter: Secondary | ICD-10-CM

## 2015-12-20 HISTORY — PX: TOTAL HIP REVISION: SHX763

## 2015-12-20 SURGERY — TOTAL HIP REVISION
Anesthesia: General | Site: Hip | Laterality: Left

## 2015-12-20 MED ORDER — PROMETHAZINE HCL 25 MG/ML IJ SOLN
6.2500 mg | INTRAMUSCULAR | Status: DC | PRN
  Administered 2015-12-20: 6.25 mg via INTRAVENOUS

## 2015-12-20 MED ORDER — TRAMADOL HCL 50 MG PO TABS: 50.0000 mg | ORAL_TABLET | Freq: Four times a day (QID) | ORAL | Status: DC | PRN

## 2015-12-20 MED ORDER — MENTHOL 3 MG MT LOZG: 1.0000 | LOZENGE | OROMUCOSAL | Status: DC | PRN

## 2015-12-20 MED ORDER — METHOCARBAMOL 1000 MG/10ML IJ SOLN
500.0000 mg | Freq: Four times a day (QID) | INTRAVENOUS | Status: DC | PRN
  Administered 2015-12-20: 500 mg via INTRAVENOUS
  Filled 2015-12-20 (×2): qty 5

## 2015-12-20 MED ORDER — METHOCARBAMOL 500 MG PO TABS
500.0000 mg | ORAL_TABLET | Freq: Four times a day (QID) | ORAL | Status: DC | PRN
  Administered 2015-12-21 – 2015-12-22 (×5): 500 mg via ORAL
  Filled 2015-12-20 (×6): qty 1

## 2015-12-20 MED ORDER — ONDANSETRON HCL 4 MG/2ML IJ SOLN
INTRAMUSCULAR | Status: DC | PRN
  Administered 2015-12-20: 4 mg via INTRAVENOUS

## 2015-12-20 MED ORDER — DEXAMETHASONE SODIUM PHOSPHATE 10 MG/ML IJ SOLN
INTRAMUSCULAR | Status: AC
  Filled 2015-12-20: qty 1

## 2015-12-20 MED ORDER — PROMETHAZINE HCL 25 MG/ML IJ SOLN
INTRAMUSCULAR | Status: AC
  Filled 2015-12-20: qty 1

## 2015-12-20 MED ORDER — BISACODYL 10 MG RE SUPP: 10.0000 mg | Freq: Every day | RECTAL | Status: DC | PRN

## 2015-12-20 MED ORDER — HYDROMORPHONE HCL 1 MG/ML IJ SOLN
INTRAMUSCULAR | Status: AC
  Filled 2015-12-20: qty 1

## 2015-12-20 MED ORDER — MIDAZOLAM HCL 2 MG/2ML IJ SOLN
INTRAMUSCULAR | Status: AC
  Filled 2015-12-20: qty 2

## 2015-12-20 MED ORDER — TRANEXAMIC ACID 1000 MG/10ML IV SOLN
1000.0000 mg | Freq: Once | INTRAVENOUS | Status: AC
  Administered 2015-12-20: 1000 mg via INTRAVENOUS
  Filled 2015-12-20: qty 10

## 2015-12-20 MED ORDER — PROPOFOL 10 MG/ML IV BOLUS
INTRAVENOUS | Status: AC
  Filled 2015-12-20: qty 40

## 2015-12-20 MED ORDER — OXYCODONE HCL 5 MG PO TABS
5.0000 mg | ORAL_TABLET | ORAL | Status: DC | PRN
  Administered 2015-12-20 (×2): 5 mg via ORAL
  Administered 2015-12-21 – 2015-12-22 (×11): 10 mg via ORAL
  Filled 2015-12-20: qty 1
  Filled 2015-12-20 (×2): qty 2
  Filled 2015-12-20: qty 1
  Filled 2015-12-20 (×9): qty 2

## 2015-12-20 MED ORDER — MIDAZOLAM HCL 5 MG/5ML IJ SOLN
INTRAMUSCULAR | Status: DC | PRN
  Administered 2015-12-20: 2 mg via INTRAVENOUS

## 2015-12-20 MED ORDER — HYDROMORPHONE HCL 1 MG/ML IJ SOLN
INTRAMUSCULAR | Status: DC | PRN
  Administered 2015-12-20 (×4): 0.5 mg via INTRAVENOUS

## 2015-12-20 MED ORDER — CHLORHEXIDINE GLUCONATE 4 % EX LIQD: 60.0000 mL | Freq: Once | CUTANEOUS | Status: DC

## 2015-12-20 MED ORDER — ROCURONIUM BROMIDE 100 MG/10ML IV SOLN
INTRAVENOUS | Status: DC | PRN
  Administered 2015-12-20: 10 mg via INTRAVENOUS
  Administered 2015-12-20: 30 mg via INTRAVENOUS
  Administered 2015-12-20: 10 mg via INTRAVENOUS

## 2015-12-20 MED ORDER — BUPIVACAINE HCL (PF) 0.25 % IJ SOLN
INTRAMUSCULAR | Status: AC
  Filled 2015-12-20: qty 30

## 2015-12-20 MED ORDER — TRANEXAMIC ACID 1000 MG/10ML IV SOLN
1000.0000 mg | INTRAVENOUS | Status: AC
  Administered 2015-12-20: 1000 mg via INTRAVENOUS
  Filled 2015-12-20: qty 10

## 2015-12-20 MED ORDER — RIVAROXABAN 10 MG PO TABS
10.0000 mg | ORAL_TABLET | Freq: Every day | ORAL | Status: DC
  Administered 2015-12-21 – 2015-12-22 (×2): 10 mg via ORAL
  Filled 2015-12-20 (×3): qty 1

## 2015-12-20 MED ORDER — METOCLOPRAMIDE HCL 5 MG/ML IJ SOLN: 5.0000 mg | Freq: Three times a day (TID) | INTRAMUSCULAR | Status: DC | PRN

## 2015-12-20 MED ORDER — HYDROMORPHONE HCL 2 MG/ML IJ SOLN
INTRAMUSCULAR | Status: AC
  Filled 2015-12-20: qty 1

## 2015-12-20 MED ORDER — SUGAMMADEX SODIUM 200 MG/2ML IV SOLN
INTRAVENOUS | Status: AC
  Filled 2015-12-20: qty 2

## 2015-12-20 MED ORDER — LIDOCAINE HCL (CARDIAC) 20 MG/ML IV SOLN
INTRAVENOUS | Status: DC | PRN
  Administered 2015-12-20: 100 mg via INTRAVENOUS

## 2015-12-20 MED ORDER — DEXAMETHASONE SODIUM PHOSPHATE 10 MG/ML IJ SOLN
10.0000 mg | Freq: Once | INTRAMUSCULAR | Status: AC
  Administered 2015-12-20: 10 mg via INTRAVENOUS

## 2015-12-20 MED ORDER — LIDOCAINE HCL (CARDIAC) 20 MG/ML IV SOLN
INTRAVENOUS | Status: AC
  Filled 2015-12-20: qty 5

## 2015-12-20 MED ORDER — HYDROMORPHONE HCL 1 MG/ML IJ SOLN
0.2500 mg | INTRAMUSCULAR | Status: DC | PRN
  Administered 2015-12-20 (×3): 0.5 mg via INTRAVENOUS

## 2015-12-20 MED ORDER — CEFAZOLIN SODIUM-DEXTROSE 2-3 GM-% IV SOLR
2.0000 g | Freq: Four times a day (QID) | INTRAVENOUS | Status: AC
  Administered 2015-12-20 – 2015-12-21 (×2): 2 g via INTRAVENOUS
  Filled 2015-12-20 (×2): qty 50

## 2015-12-20 MED ORDER — CEFAZOLIN SODIUM-DEXTROSE 2-3 GM-% IV SOLR
INTRAVENOUS | Status: AC
  Filled 2015-12-20: qty 50

## 2015-12-20 MED ORDER — MORPHINE SULFATE (PF) 2 MG/ML IV SOLN: 1.0000 mg | INTRAVENOUS | Status: DC | PRN

## 2015-12-20 MED ORDER — LACTATED RINGERS IV SOLN
INTRAVENOUS | Status: DC | PRN
  Administered 2015-12-20: 12:00:00 via INTRAVENOUS
  Administered 2015-12-20: 16:00:00

## 2015-12-20 MED ORDER — ONDANSETRON HCL 4 MG/2ML IJ SOLN: 4.0000 mg | Freq: Four times a day (QID) | INTRAMUSCULAR | Status: DC | PRN

## 2015-12-20 MED ORDER — SODIUM CHLORIDE 0.9 % IV SOLN: INTRAVENOUS | Status: DC

## 2015-12-20 MED ORDER — CARVEDILOL 25 MG PO TABS
25.0000 mg | ORAL_TABLET | Freq: Two times a day (BID) | ORAL | Status: DC
  Administered 2015-12-20 – 2015-12-22 (×4): 25 mg via ORAL
  Filled 2015-12-20 (×6): qty 1

## 2015-12-20 MED ORDER — POLYETHYLENE GLYCOL 3350 17 G PO PACK: 17.0000 g | PACK | Freq: Every day | ORAL | Status: DC | PRN

## 2015-12-20 MED ORDER — ACETAMINOPHEN 10 MG/ML IV SOLN
1000.0000 mg | Freq: Once | INTRAVENOUS | Status: AC
  Administered 2015-12-20: 1000 mg via INTRAVENOUS
  Filled 2015-12-20: qty 100

## 2015-12-20 MED ORDER — SUCCINYLCHOLINE CHLORIDE 20 MG/ML IJ SOLN
INTRAMUSCULAR | Status: DC | PRN
  Administered 2015-12-20: 100 mg via INTRAVENOUS

## 2015-12-20 MED ORDER — PHENOL 1.4 % MT LIQD: 1.0000 | OROMUCOSAL | Status: DC | PRN

## 2015-12-20 MED ORDER — PROPOFOL 500 MG/50ML IV EMUL: INTRAVENOUS | Status: DC | PRN

## 2015-12-20 MED ORDER — ACETAMINOPHEN 10 MG/ML IV SOLN
INTRAVENOUS | Status: AC
  Filled 2015-12-20: qty 100

## 2015-12-20 MED ORDER — BUPROPION HCL ER (XL) 300 MG PO TB24
300.0000 mg | ORAL_TABLET | Freq: Every day | ORAL | Status: DC
  Administered 2015-12-21 – 2015-12-22 (×2): 300 mg via ORAL
  Filled 2015-12-20 (×2): qty 1

## 2015-12-20 MED ORDER — DEXAMETHASONE SODIUM PHOSPHATE 10 MG/ML IJ SOLN
10.0000 mg | Freq: Once | INTRAMUSCULAR | Status: DC
  Filled 2015-12-20: qty 1

## 2015-12-20 MED ORDER — FLEET ENEMA 7-19 GM/118ML RE ENEM: 1.0000 | ENEMA | Freq: Once | RECTAL | Status: DC | PRN

## 2015-12-20 MED ORDER — DOCUSATE SODIUM 100 MG PO CAPS
100.0000 mg | ORAL_CAPSULE | Freq: Two times a day (BID) | ORAL | Status: DC
  Administered 2015-12-20 – 2015-12-22 (×4): 100 mg via ORAL

## 2015-12-20 MED ORDER — ROCURONIUM BROMIDE 100 MG/10ML IV SOLN
INTRAVENOUS | Status: AC
  Filled 2015-12-20: qty 1

## 2015-12-20 MED ORDER — AMITRIPTYLINE HCL 10 MG PO TABS
10.0000 mg | ORAL_TABLET | Freq: Every day | ORAL | Status: DC | PRN
  Filled 2015-12-20: qty 1

## 2015-12-20 MED ORDER — ACETAMINOPHEN 650 MG RE SUPP: 650.0000 mg | Freq: Four times a day (QID) | RECTAL | Status: DC | PRN

## 2015-12-20 MED ORDER — ACETAMINOPHEN 325 MG PO TABS: 650.0000 mg | ORAL_TABLET | Freq: Four times a day (QID) | ORAL | Status: DC | PRN

## 2015-12-20 MED ORDER — ONDANSETRON HCL 4 MG PO TABS: 4.0000 mg | ORAL_TABLET | Freq: Four times a day (QID) | ORAL | Status: DC | PRN

## 2015-12-20 MED ORDER — ONDANSETRON HCL 4 MG/2ML IJ SOLN
INTRAMUSCULAR | Status: AC
  Filled 2015-12-20: qty 2

## 2015-12-20 MED ORDER — PROPOFOL 10 MG/ML IV BOLUS
INTRAVENOUS | Status: DC | PRN
  Administered 2015-12-20: 250 mg via INTRAVENOUS

## 2015-12-20 MED ORDER — FENTANYL CITRATE (PF) 100 MCG/2ML IJ SOLN
INTRAMUSCULAR | Status: AC
  Filled 2015-12-20: qty 2

## 2015-12-20 MED ORDER — TEMAZEPAM 15 MG PO CAPS: 15.0000 mg | ORAL_CAPSULE | Freq: Every evening | ORAL | Status: DC | PRN

## 2015-12-20 MED ORDER — DIPHENHYDRAMINE HCL 12.5 MG/5ML PO ELIX: 12.5000 mg | ORAL_SOLUTION | ORAL | Status: DC | PRN

## 2015-12-20 MED ORDER — ACETAMINOPHEN 500 MG PO TABS
1000.0000 mg | ORAL_TABLET | Freq: Four times a day (QID) | ORAL | Status: AC
  Administered 2015-12-20 – 2015-12-21 (×4): 1000 mg via ORAL
  Filled 2015-12-20 (×4): qty 2

## 2015-12-20 MED ORDER — CEFAZOLIN SODIUM-DEXTROSE 2-3 GM-% IV SOLR
2.0000 g | INTRAVENOUS | Status: AC
  Administered 2015-12-20: 2 g via INTRAVENOUS

## 2015-12-20 MED ORDER — SODIUM CHLORIDE 0.9 % IV SOLN
INTRAVENOUS | Status: DC
  Administered 2015-12-20 – 2015-12-21 (×2): via INTRAVENOUS

## 2015-12-20 MED ORDER — BUPIVACAINE HCL 0.25 % IJ SOLN
INTRAMUSCULAR | Status: DC | PRN
  Administered 2015-12-20: 20 mL

## 2015-12-20 MED ORDER — FENTANYL CITRATE (PF) 100 MCG/2ML IJ SOLN
INTRAMUSCULAR | Status: DC | PRN
  Administered 2015-12-20 (×4): 50 ug via INTRAVENOUS

## 2015-12-20 MED ORDER — SUGAMMADEX SODIUM 200 MG/2ML IV SOLN
INTRAVENOUS | Status: DC | PRN
  Administered 2015-12-20: 200 mg via INTRAVENOUS

## 2015-12-20 MED ORDER — METOCLOPRAMIDE HCL 10 MG PO TABS: 5.0000 mg | ORAL_TABLET | Freq: Three times a day (TID) | ORAL | Status: DC | PRN

## 2015-12-20 SURGICAL SUPPLY — 71 items
BAG DECANTER FOR FLEXI CONT (MISCELLANEOUS) ×1 IMPLANT
BAG SPEC THK2 15X12 ZIP CLS (MISCELLANEOUS) ×1
BAG ZIPLOCK 12X15 (MISCELLANEOUS) ×4 IMPLANT
BIT DRILL 2.8X128 (BIT) ×2 IMPLANT
BLADE EXTENDED COATED 6.5IN (ELECTRODE) ×3 IMPLANT
BLADE SAW SAG 73X25 THK (BLADE) ×1
BLADE SAW SGTL 73X25 THK (BLADE) ×1 IMPLANT
BRUSH FEMORAL CANAL (MISCELLANEOUS) IMPLANT
CABLE (Orthopedic Implant) ×1 IMPLANT
DRAPE INCISE IOBAN 66X45 STRL (DRAPES) ×2 IMPLANT
DRAPE ORTHO SPLIT 77X108 STRL (DRAPES) ×4
DRAPE POUCH INSTRU U-SHP 10X18 (DRAPES) ×2 IMPLANT
DRAPE SURG ORHT 6 SPLT 77X108 (DRAPES) ×2 IMPLANT
DRAPE U-SHAPE 47X51 STRL (DRAPES) ×2 IMPLANT
DRSG ADAPTIC 3X8 NADH LF (GAUZE/BANDAGES/DRESSINGS) ×1 IMPLANT
DRSG EMULSION OIL 3X16 NADH (GAUZE/BANDAGES/DRESSINGS) ×2 IMPLANT
DRSG MEPILEX BORDER 4X4 (GAUZE/BANDAGES/DRESSINGS) ×3 IMPLANT
DRSG MEPILEX BORDER 4X8 (GAUZE/BANDAGES/DRESSINGS) ×2 IMPLANT
DURAPREP 26ML APPLICATOR (WOUND CARE) ×2 IMPLANT
ELECT PENCIL ROCKER SW 15FT (MISCELLANEOUS) ×1 IMPLANT
ELECT REM PT RETURN 9FT ADLT (ELECTROSURGICAL) ×2
ELECTRODE REM PT RTRN 9FT ADLT (ELECTROSURGICAL) ×1 IMPLANT
EVACUATOR 1/8 PVC DRAIN (DRAIN) ×2 IMPLANT
FACESHIELD WRAPAROUND (MASK) ×8 IMPLANT
FACESHIELD WRAPAROUND OR TEAM (MASK) ×4 IMPLANT
GAUZE SPONGE 4X4 12PLY STRL (GAUZE/BANDAGES/DRESSINGS) ×2 IMPLANT
GLOVE BIO SURGEON STRL SZ7.5 (GLOVE) ×2 IMPLANT
GLOVE BIO SURGEON STRL SZ8 (GLOVE) ×2 IMPLANT
GLOVE BIOGEL PI IND STRL 8 (GLOVE) ×3 IMPLANT
GLOVE BIOGEL PI INDICATOR 8 (GLOVE) ×3
GLOVE SURG SS PI 6.5 STRL IVOR (GLOVE) ×4 IMPLANT
GOWN STRL REUS W/TWL LRG LVL3 (GOWN DISPOSABLE) ×4 IMPLANT
GOWN STRL REUS W/TWL XL LVL3 (GOWN DISPOSABLE) ×2 IMPLANT
HANDPIECE INTERPULSE COAX TIP (DISPOSABLE)
HEAD OSTEO CTAPER L FIT (Orthopedic Implant) ×2 IMPLANT
HEAD OSTEO CTAPER L FIT 32 +5 (Orthopedic Implant) IMPLANT
IMMOBILIZER KNEE 20 (SOFTGOODS) ×2
IMMOBILIZER KNEE 20 THIGH 36 (SOFTGOODS) IMPLANT
INSERT ACTB 10D 54X32XSTRL LF (Orthopedic Implant) IMPLANT
INSERT OSTEO CFIRE ACET (Orthopedic Implant) ×2 IMPLANT
INSRT ACTB 10D 54X32XSTRL LF (Orthopedic Implant) ×1 IMPLANT
MANIFOLD NEPTUNE II (INSTRUMENTS) ×2 IMPLANT
MARKER SKIN DUAL TIP RULER LAB (MISCELLANEOUS) ×2 IMPLANT
NDL SAFETY ECLIPSE 18X1.5 (NEEDLE) ×1 IMPLANT
NEEDLE HYPO 18GX1.5 SHARP (NEEDLE) ×2
NS IRRIG 1000ML POUR BTL (IV SOLUTION) ×2 IMPLANT
PADDING CAST COTTON 6X4 STRL (CAST SUPPLIES) ×1 IMPLANT
PASSER SUT SWANSON 36MM LOOP (INSTRUMENTS) ×2 IMPLANT
POSITIONER SURGICAL ARM (MISCELLANEOUS) ×2 IMPLANT
PRESSURIZER FEMORAL UNIV (MISCELLANEOUS) IMPLANT
SET HNDPC FAN SPRY TIP SCT (DISPOSABLE) IMPLANT
SPONGE LAP 18X18 X RAY DECT (DISPOSABLE) ×3 IMPLANT
STAPLER VISISTAT 35W (STAPLE) ×3 IMPLANT
SUCTION FRAZIER HANDLE 10FR (MISCELLANEOUS) ×1
SUCTION TUBE FRAZIER 10FR DISP (MISCELLANEOUS) ×1 IMPLANT
SUT ETHIBOND NAB CT1 #1 30IN (SUTURE) ×4 IMPLANT
SUT VIC AB 1 CT1 27 (SUTURE) ×6
SUT VIC AB 1 CT1 27XBRD ANTBC (SUTURE) ×3 IMPLANT
SUT VIC AB 2-0 CT1 27 (SUTURE) ×6
SUT VIC AB 2-0 CT1 TAPERPNT 27 (SUTURE) ×3 IMPLANT
SUT VLOC 180 0 24IN GS25 (SUTURE) ×4 IMPLANT
SWAB COLLECTION DEVICE MRSA (MISCELLANEOUS) ×1 IMPLANT
SWAB CULTURE ESWAB REG 1ML (MISCELLANEOUS) IMPLANT
SYR 50ML LL SCALE MARK (SYRINGE) ×2 IMPLANT
TOWEL OR 17X26 10 PK STRL BLUE (TOWEL DISPOSABLE) ×4 IMPLANT
TOWER CARTRIDGE SMART MIX (DISPOSABLE) IMPLANT
TRAY FOLEY W/METER SILVER 14FR (SET/KITS/TRAYS/PACK) ×1 IMPLANT
TRAY FOLEY W/METER SILVER 16FR (SET/KITS/TRAYS/PACK) ×2 IMPLANT
TUBE KAMVAC SUCTION (TUBING) IMPLANT
WATER STERILE IRR 1500ML POUR (IV SOLUTION) ×2 IMPLANT
YANKAUER SUCT BULB TIP 10FT TU (MISCELLANEOUS) ×2 IMPLANT

## 2015-12-20 NOTE — Anesthesia Procedure Notes (Signed)
Procedure Name: Intubation Date/Time: 12/20/2015 1:17 PM Performed by: Jarvis Newcomer A Pre-anesthesia Checklist: Patient identified, Timeout performed, Emergency Drugs available, Suction available and Patient being monitored Patient Re-evaluated:Patient Re-evaluated prior to inductionOxygen Delivery Method: Circle system utilized Preoxygenation: Pre-oxygenation with 100% oxygen Intubation Type: IV induction Ventilation: Mask ventilation without difficulty Laryngoscope Size: Mac and 4 Grade View: Grade I Tube type: Oral Tube size: 7.5 mm Number of attempts: 1 Airway Equipment and Method: Stylet Placement Confirmation: breath sounds checked- equal and bilateral,  ETT inserted through vocal cords under direct vision and positive ETCO2 Secured at: 22 cm Tube secured with: Tape Dental Injury: Teeth and Oropharynx as per pre-operative assessment
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
## 2015-12-20 NOTE — Anesthesia Preprocedure Evaluation (Addendum)
Anesthesia Evaluation  Patient identified by MRN, date of birth, ID band Patient awake    Reviewed: Allergy & Precautions, H&P , Patient's Chart, lab work & pertinent test results  Airway Mallampati: II  TM Distance: >3 FB Neck ROM: full    Dental no notable dental hx.    Pulmonary former smoker,    Pulmonary exam normal breath sounds clear to auscultation       Cardiovascular Exercise Tolerance: Good  Rhythm:regular Rate:Normal     Neuro/Psych    GI/Hepatic   Endo/Other    Renal/GU      Musculoskeletal   Abdominal   Peds  Hematology   Anesthesia Other Findings Hb 15 No anticoagulants  Reproductive/Obstetrics                           Anesthesia Physical Anesthesia Plan  ASA: II  Anesthesia Plan: General   Post-op Pain Management:    Induction:   Airway Management Planned: Oral ETT  Additional Equipment:   Intra-op Plan:   Post-operative Plan: Extubation in OR  Informed Consent: I have reviewed the patients History and Physical, chart, labs and discussed the procedure including the risks, benefits and alternatives for the proposed anesthesia with the patient or authorized representative who has indicated his/her understanding and acceptance.   Dental Advisory Given  Plan Discussed with: CRNA  Anesthesia Plan Comments: ( )       Anesthesia Quick Evaluation
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
## 2015-12-20 NOTE — Interval H&P Note (Signed)
History and Physical Interval Note:  12/20/2015 1:02 PM  Locke J Leisa Lenz.  has presented today for surgery, with the diagnosis of LOOSE LEFT TOTAL HIP ARTHROPLASTY   The various methods of treatment have been discussed with the patient and family. After consideration of risks, benefits and other options for treatment, the patient has consented to  Procedure(s): LEFT TOTAL HIP ARTHROPLSTY REVISION (Left) as a surgical intervention .  The patient's history has been reviewed, patient examined, no change in status, stable for surgery.  I have reviewed the patient's chart and labs.  Questions were answered to the patient's satisfaction.     Loanne Drilling

## 2015-12-20 NOTE — Brief Op Note (Signed)
12/20/2015  3:24 PM  PATIENT:  Elmarie Mainland.  65 y.o. male  PRE-OPERATIVE DIAGNOSIS:  FAILED LEFT TOTAL HIP ARTHROPLASTY   POST-OPERATIVE DIAGNOSIS: FAILED LEFT TOTAL HIP ARTHROPLASTY   PROCEDURE:  Procedure(s): LEFT TOTAL HIP ARTHROPLSTY REVISION (Left)  SURGEON:  Surgeon(s) and Role:    * Ollen Gross, MD - Primary  PHYSICIAN ASSISTANT:   ASSISTANTS: Avel Peace, PA-C   ANESTHESIA:   general  EBL:  Total I/O In: 1000 [I.V.:1000] Out: 800 [Urine:150; Blood:650]  BLOOD ADMINISTERED:none  DRAINS: (medium) Hemovact drain(s) in the left hip with  Suction Open   LOCAL MEDICATIONS USED:  MARCAINE     SPECIMEN:  No Specimen  DISPOSITION OF SPECIMEN:  N/A  COUNTS:  YES  TOURNIQUET:  * No tourniquets in log *  DICTATION: .Other Dictation: Dictation Number [id number]  PLAN OF CARE: Admit to inpatient   PATIENT DISPOSITION:  PACU - hemodynamically stable.

## 2015-12-20 NOTE — Transfer of Care (Signed)
Immediate Anesthesia Transfer of Care Note  Patient: Roy Becker.  Procedure(s) Performed: Procedure(s): LEFT TOTAL HIP ARTHROPLASTY Partial REVISION (Left)  Patient Location: PACU  Anesthesia Type:General  Level of Consciousness: awake, alert , oriented and patient cooperative  Airway & Oxygen Therapy: Patient Spontanous Breathing and Patient connected to face mask oxygen  Post-op Assessment: Report given to RN, Post -op Vital signs reviewed and stable and Patient moving all extremities  Post vital signs: Reviewed and stable  Last Vitals:  Filed Vitals:   12/20/15 1044  BP: 138/83  Pulse: 74  Temp: 36.4 C  Resp: 18    Complications: No apparent anesthesia complications

## 2015-12-21 ENCOUNTER — Encounter (HOSPITAL_COMMUNITY): Payer: Self-pay | Admitting: Orthopedic Surgery

## 2015-12-21 LAB — BASIC METABOLIC PANEL
ANION GAP: 11 (ref 5–15)
BUN: 15 mg/dL (ref 6–20)
CALCIUM: 8.3 mg/dL — AB (ref 8.9–10.3)
CO2: 21 mmol/L — ABNORMAL LOW (ref 22–32)
CREATININE: 0.9 mg/dL (ref 0.61–1.24)
Chloride: 103 mmol/L (ref 101–111)
Glucose, Bld: 346 mg/dL — ABNORMAL HIGH (ref 65–99)
Potassium: 4.6 mmol/L (ref 3.5–5.1)
SODIUM: 135 mmol/L (ref 135–145)

## 2015-12-21 LAB — TYPE AND SCREEN
ABO/RH(D): O POS
ANTIBODY SCREEN: POSITIVE
DAT, IGG: NEGATIVE

## 2015-12-21 LAB — CBC
HEMATOCRIT: 35.6 % — AB (ref 39.0–52.0)
Hemoglobin: 12.6 g/dL — ABNORMAL LOW (ref 13.0–17.0)
MCH: 32 pg (ref 26.0–34.0)
MCHC: 35.4 g/dL (ref 30.0–36.0)
MCV: 90.4 fL (ref 78.0–100.0)
PLATELETS: 176 10*3/uL (ref 150–400)
RBC: 3.94 MIL/uL — ABNORMAL LOW (ref 4.22–5.81)
RDW: 12.5 % (ref 11.5–15.5)
WBC: 13.3 10*3/uL — AB (ref 4.0–10.5)

## 2015-12-21 NOTE — Anesthesia Postprocedure Evaluation (Signed)
Anesthesia Post Note  Patient: Roy Becker.  Procedure(s) Performed: Procedure(s) (LRB): LEFT TOTAL HIP ARTHROPLASTY Partial REVISION (Left)  Patient location during evaluation: PACU Anesthesia Type: Spinal Level of consciousness: awake Pain management: satisfactory to patient Vital Signs Assessment: post-procedure vital signs reviewed and stable Respiratory status: spontaneous breathing Cardiovascular status: blood pressure returned to baseline Postop Assessment: no headache and spinal receding Anesthetic complications: no    Last Vitals:  Filed Vitals:   12/20/15 2120 12/21/15 0157  BP: 164/76 149/64  Pulse: 89 87  Temp: 36.7 C 36.6 C  Resp: 16 16    Last Pain:  Filed Vitals:   12/21/15 0158  PainSc: 8                  Daviyon Widmayer EDWARD

## 2015-12-21 NOTE — Progress Notes (Signed)
Physical Therapy Treatment Patient Details Name: Roy Becker. MRN: [id number] DOB: Dec 01, 1950 Today's Date: 12/21/2015    History of Present Illness s/p posterior L THA revision    PT Comments    Pt requiring increased time for all activities but progressing steadily with mobility.  Follow Up Recommendations  Home health PT;Supervision - Intermittent     Equipment Recommendations  None recommended by PT    Recommendations for Other Services       Precautions / Restrictions Precautions Precautions: Posterior Hip Restrictions Weight Bearing Restrictions: No Other Position/Activity Restrictions: WBAT    Mobility  Bed Mobility Overal bed mobility: Needs Assistance Bed Mobility: Sit to Supine       Sit to supine: Min assist   General bed mobility comments: cues for sequence and adherence to THP - min assist to manage L LE  Transfers Overall transfer level: Needs assistance Equipment used: Rolling walker (2 wheeled) Transfers: Sit to/from Stand Sit to Stand: Min guard         General transfer comment: cues for hand placement, LLE position/THP  Ambulation/Gait Ambulation/Gait assistance: Min assist;Min guard Ambulation Distance (Feet): 140 Feet (and 20' back from bathroom) Assistive device: Rolling walker (2 wheeled) Gait Pattern/deviations: Step-to pattern;Decreased step length - right;Decreased step length - left;Shuffle;Trunk flexed Gait velocity: decr Gait velocity interpretation: Below normal speed for age/gender General Gait Details: incr time, cues for sequence, RW distance from self;  pt takes multiple standing rest breaks   Stairs            Wheelchair Mobility    Modified Rankin (Stroke Patients Only)       Balance                                    Cognition Arousal/Alertness: Awake/alert Behavior During Therapy: WFL for tasks assessed/performed Overall Cognitive Status: Within Functional Limits for tasks  assessed                      Exercises Total Joint Exercises Ankle Circles/Pumps: AROM;Both;15 reps;Supine Quad Sets: AROM;Both;10 reps;Supine Heel Slides: AAROM;Left;15 reps;Supine Hip ABduction/ADduction: AAROM;Left;15 reps;Supine    General Comments        Pertinent Vitals/Pain Pain Assessment: 0-10 Pain Score: 5  Pain Location: L hip Pain Descriptors / Indicators: Sore Pain Intervention(s): Limited activity within patient's tolerance;Monitored during session;Premedicated before session    Home Living                      Prior Function            PT Goals (current goals can now be found in the care plan section) Acute Rehab PT Goals Patient Stated Goal: to get back to life PT Goal Formulation: With patient Time For Goal Achievement: 12/28/15 Potential to Achieve Goals: Good Progress towards PT goals: Progressing toward goals    Frequency  7X/week    PT Plan Current plan remains appropriate    Co-evaluation             End of Session Equipment Utilized During Treatment: Gait belt Activity Tolerance: Patient tolerated treatment well Patient left: in bed;with call bell/phone within reach;with family/visitor present     Time: 1412-1510 PT Time Calculation (min) (ACUTE ONLY): 58 min  Charges:  $Gait Training: 23-37 mins $Therapeutic Exercise: 8-22 mins $Therapeutic Activity: 8-22 mins  G Codes:      Roy Becker 2016/01/05, 4:53 PM

## 2015-12-21 NOTE — Discharge Instructions (Addendum)
Dr. Gaynelle Arabian Total Joint Specialist [facility] [address] [place], [place] [address] [phone]   POSTERIOR TOTAL HIP REPLACEMENT POSTOPERATIVE DIRECTIONS  Hip Rehabilitation, Guidelines Following Surgery  The results of a hip operation are greatly improved after range of motion and muscle strengthening exercises. Follow all safety measures which are given to protect your hip. If any of these exercises cause increased pain or swelling in your joint, decrease the amount until you are comfortable again. Then slowly increase the exercises. Call your caregiver if you have problems or questions.   HOME CARE INSTRUCTIONS  Remove items at home which could result in a fall. This includes throw rugs or furniture in walking pathways.   ICE to the affected hip every three hours for 30 minutes at a time and then as needed for pain and swelling.  Continue to use ice on the hip for pain and swelling from surgery. You may notice swelling that will progress down to the foot and ankle.  This is normal after surgery.  Elevate the leg when you are not up walking on it.    Continue to use the breathing machine which will help keep your temperature down.  It is common for your temperature to cycle up and down following surgery, especially at night when you are not up moving around and exerting yourself.  The breathing machine keeps your lungs expanded and your temperature down.  DIET You may resume your previous home diet once your are discharged from the hospital.  DRESSING / WOUND CARE / SHOWERING You may shower 3 days after surgery, but keep the wounds dry during showering.  You may use an occlusive plastic wrap (Press'n Seal for example), NO SOAKING/SUBMERGING IN THE BATHTUB.  If the bandage gets wet, change with a clean dry gauze.  If the incision gets wet, pat the wound dry with a clean towel. You may start showering once you are discharged home but do not submerge  the incision under water. Just pat the incision dry and apply a dry gauze dressing on daily. Change the surgical dressing daily and reapply a dry dressing each time.    ACTIVITY Walk with your walker as instructed. Use walker as long as suggested by your caregivers. Avoid periods of inactivity such as sitting longer than an hour when not asleep. This helps prevent blood clots.  You may resume a sexual relationship in one month or when given the OK by your doctor.  You may return to work once you are cleared by your doctor.  Do not drive a car for 6 weeks or until released by you surgeon.  Do not drive while taking narcotics.  WEIGHT BEARING Weight bearing as tolerated with assist device (walker, cane, etc) as directed, use it as long as suggested by your surgeon or therapist, typically at least 4-6 weeks.  POSTOPERATIVE CONSTIPATION PROTOCOL Constipation - defined medically as fewer than three stools per week and severe constipation as less than one stool per week.  One of the most common issues patients have following surgery is constipation.  Even if you have a regular bowel pattern at home, your normal regimen is likely to be disrupted due to multiple reasons following surgery.  Combination of anesthesia, postoperative narcotics, change in appetite and fluid intake all can affect your bowels.  In order to avoid complications following surgery, here are some recommendations in order to help you during your recovery period.  Colace (docusate) - Pick up an over-the-counter  form of Colace or another stool softener and take twice a day as long as you are requiring postoperative pain medications.  Take with a full glass of water daily.  If you experience loose stools or diarrhea, hold the colace until you stool forms back up.  If your symptoms do not get better within 1 week or if they get worse, check with your doctor. ° °Dulcolax (bisacodyl) - Pick up over-the-counter and take as directed by the  product packaging as needed to assist with the movement of your bowels.  Take with a full glass of water.  Use this product as needed if not relieved by Colace only.  ° °MiraLax (polyethylene glycol) - Pick up over-the-counter to have on hand.  MiraLax is a solution that will increase the amount of water in your bowels to assist with bowel movements.  Take as directed and can mix with a glass of water, juice, soda, coffee, or tea.  Take if you go more than two days without a movement. °Do not use MiraLax more than once per day. Call your doctor if you are still constipated or irregular after using this medication for 7 days in a row. ° °If you continue to have problems with postoperative constipation, please contact the office for further assistance and recommendations.  If you experience "the worst abdominal pain ever" or develop nausea or vomiting, please contact the office immediatly for further recommendations for treatment. ° °ITCHING ° If you experience itching with your medications, try taking only a single pain pill, or even half a pain pill at a time.  You can also use Benadryl over the counter for itching or also to help with sleep.  ° °TED HOSE STOCKINGS °Wear the elastic stockings on both legs for three weeks following surgery during the day but you may remove then at night for sleeping. ° °MEDICATIONS °See your medication summary on the “After Visit Summary” that the nursing staff will review with you prior to discharge.  You may have some home medications which will be placed on hold until you complete the course of blood thinner medication.  It is important for you to complete the blood thinner medication as prescribed by your surgeon.  Continue your approved medications as instructed at time of discharge. ° °PRECAUTIONS °If you experience chest pain or shortness of breath - call 911 immediately for transfer to the hospital emergency department.  °If you develop a fever greater that 101 F, purulent  drainage from wound, increased redness or drainage from wound, foul odor from the wound/dressing, or calf pain - CONTACT YOUR SURGEON.   °                                                °FOLLOW-UP APPOINTMENTS °Make sure you keep all of your appointments after your operation with your surgeon and caregivers. You should call the office at the above phone number and make an appointment for approximately two weeks after the date of your surgery or on the date instructed by your surgeon outlined in the "After Visit Summary". ° °RANGE OF MOTION AND STRENGTHENING EXERCISES  °These exercises are designed to help you keep full movement of your hip joint. Follow your caregiver's or physical therapist's instructions. Perform all exercises about fifteen times, three times per day or as directed. Exercise both hips, even if you   have had only one joint replacement. These exercises can be done on a training (exercise) mat, on the floor, on a table or on a bed. Use whatever works the best and is most comfortable for you. Use music or television while you are exercising so that the exercises are a pleasant break in your day. This will make your life better with the exercises acting as a break in routine you can look forward to.  Lying on your back, slowly slide your foot toward your buttocks, raising your knee up off the floor. Then slowly slide your foot back down until your leg is straight again.  Lying on your back spread your legs as far apart as you can without causing discomfort.  Lying on your side, raise your upper leg and foot straight up from the floor as far as is comfortable. Slowly lower the leg and repeat.  Lying on your back, tighten up the muscle in the front of your thigh (quadriceps muscles). You can do this by keeping your leg straight and trying to raise your heel off the floor. This helps strengthen the largest muscle supporting your knee.  Lying on your back, tighten up the muscles of your buttocks both  with the legs straight and with the knee bent at a comfortable angle while keeping your heel on the floor.      IF YOU ARE TRANSFERRED TO A SKILLED REHAB FACILITY If the patient is transferred to a skilled rehab facility following release from the hospital, a list of the current medications will be sent to the facility for the patient to continue.  When discharged from the skilled rehab facility, please have the facility set up the patient's [place] prior to being released. Also, the skilled facility will be responsible for providing the patient with their medications at time of release from the facility to include their pain medication, the muscle relaxants, and their blood thinner medication. If the patient is still at the rehab facility at time of the two week follow up appointment, the skilled rehab facility will also need to assist the patient in arranging follow up appointment in our office and any transportation needs.  MAKE SURE YOU:  Understand these instructions.  Get help right away if you are not doing well or get worse.    Pick up stool softner and laxative for home use following surgery while on pain medications. Do not submerge incision under water. Please use good hand washing techniques while changing dressing each day. May shower starting three days after surgery. Please use a clean towel to pat the incision dry following showers. Continue to use ice for pain and swelling after surgery. Do not use any lotions or creams on the incision until instructed by your surgeon.  Take Xarelto for two and a half more weeks, then discontinue Xarelto. Once the patient has completed the blood thinner regimen, then take a Baby 81 mg Aspirin daily for three more weeks.   Information on my medicine - XARELTO (Rivaroxaban)  This medication education was reviewed with me or my healthcare representative as part of my discharge preparation.  The pharmacist that spoke with  me during my hospital stay was:  Minda Ditto, [facility]  Why was Xarelto prescribed for you? Xarelto was prescribed for you to reduce the risk of blood clots forming after orthopedic surgery. The medical term for these abnormal blood clots is venous thromboembolism (VTE).  What do you need to know about xarelto ?  Take your Xarelto ONCE DAILY at the same time every day. You may take it either with or without food.  If you have difficulty swallowing the tablet whole, you may crush it and mix in applesauce just prior to taking your dose.  Take Xarelto exactly as prescribed by your doctor and DO NOT stop taking Xarelto without talking to the doctor who prescribed the medication.  Stopping without other VTE prevention medication to take the place of Xarelto may increase your risk of developing a clot.  After discharge, you should have regular check-up appointments with your healthcare provider that is prescribing your Xarelto.    What do you do if you miss a dose? If you miss a dose, take it as soon as you remember on the same day then continue your regularly scheduled once daily regimen the next day. Do not take two doses of Xarelto on the same day.   Important Safety Information A possible side effect of Xarelto is bleeding. You should call your healthcare provider right away if you experience any of the following: ? Bleeding from an injury or your nose that does not stop. ? Unusual colored urine (red or dark brown) or unusual colored stools (red or black). ? Unusual bruising for unknown reasons. ? A serious fall or if you hit your head (even if there is no bleeding).  Some medicines may interact with Xarelto and might increase your risk of bleeding while on Xarelto. To help avoid this, consult your healthcare provider or pharmacist prior to using any new prescription or non-prescription medications, including herbals, vitamins, non-steroidal anti-inflammatory drugs (NSAIDs) and  supplements.  Avoid Mobic while on Xarelto to avoid risk of bleed, unless OK with orthopedic surgeon to continue  This website has more information on Xarelto: VisitDestination.com.br.

## 2015-12-21 NOTE — Evaluation (Signed)
Occupational Therapy Evaluation Patient Details Name: Roy Becker. MRN: [id number] DOB: [birth year] Today's Date: 12/21/2015    History of Present Illness s/p posterior L THA revision   Clinical Impression   OT education complete. Pt aware of hip precautions and use of AE    Follow Up Recommendations  No OT follow up    Equipment Recommendations  3 in 1 bedside comode       Precautions / Restrictions Precautions Precautions: Posterior Hip Precaution Comments: sign in room Restrictions Weight Bearing Restrictions: No Other Position/Activity Restrictions: WBAT      Mobility Bed Mobility Overal bed mobility: Needs Assistance Bed Mobility: Supine to Sit     Supine to sit: HOB elevated;Min assist     General bed mobility comments: pt in chair  Transfers Overall transfer level: Needs assistance Equipment used: Rolling walker (2 wheeled) Transfers: Sit to/from [employer] Sit to Stand: Min guard Stand pivot transfers: Min guard       General transfer comment: cues for hand placement, LLE position/THP         ADL Overall ADL's : Needs assistance/impaired Eating/Feeding: Set up;Sitting   Grooming: Sitting;Set up   Upper Body Bathing: Set up;Sitting   Lower Body Bathing: Minimal assistance;Sit to/from stand;Cueing for sequencing;With adaptive equipment;Cueing for safety;Adhering to hip precautions   Upper Body Dressing : Set up;Sitting   Lower Body Dressing: Minimal assistance;Cueing for safety;With adaptive equipment;Cueing for sequencing;Adhering to hip precautions;Sit to/from stand   Toilet Transfer: Min guard;RW;Cueing for safety;Comfort height toilet Statistician Details (indicate cue type and reason): sit to stand Toileting- Architect and Hygiene: Sit to/from stand;Min guard;Adhering to hip precautions       Functional mobility during ADLs: Min guard General ADL Comments: Wife will A as home. Pt some AE but  will obtain the rest. Pt did well with hip precautions               Pertinent Vitals/Pain Pain Assessment: 0-10 Pain Score: 5  Pain Location: L hip Pain Descriptors / Indicators: Sore Pain Intervention(s): Limited activity within patient's tolerance;Monitored during session;Repositioned        Extremity/Trunk Assessment Upper Extremity Assessment Upper Extremity Assessment: Defer to OT evaluation   Lower Extremity Assessment Lower Extremity Assessment: LLE deficits/detail LLE Deficits / Details: hip flexion and knee 2+/5; ankle WFL       Communication Communication Communication: No difficulties   Cognition Arousal/Alertness: Awake/alert Behavior During Therapy: WFL for tasks assessed/performed Overall Cognitive Status: Within Functional Limits for tasks assessed                     General Comments    wife will A as needed            Home Living Family/patient expects to be discharged to:: Private residence Living Arrangements: Spouse/significant other   Type of Home: House (townhouse) Home Access: Stairs to enter [employer] of Steps: threshold only   Home Layout: One level               Home Equipment: Bedside commode;Tub bench;Walker - 2 wheels          Prior Functioning/Environment Level of Independence: Independent                      OT Goals(Current goals can be found in the care plan section) Acute Rehab OT Goals Patient Stated Goal: to get back to life  OT Frequency:  End of Session Equipment Utilized During Treatment: Engineer, water Communication: Mobility status  Activity Tolerance: Patient tolerated treatment well Patient left: in chair;with call bell/phone within reach   Time: 1030-1050 OT Time Calculation (min): 20 min Charges:  OT General Charges $OT Visit: 1 Procedure OT Evaluation $OT Eval Low Complexity: 1 Procedure G-Codes:    Alba Cory 12-29-15, 11:58  AM

## 2015-12-21 NOTE — Care Management Note (Signed)
Case Management Note  Patient Details  Name: Roy Becker. MRN: [id number] Date of Birth: Feb 12, 1951  Subjective/Objective:                  LEFT TOTAL HIP ARTHROPLASTY Partial REVISION (Left) Action/Plan: Discharge planning Expected Discharge Date:  12/22/15               Expected Discharge Plan:  [place]  In-House Referral:     Discharge planning Services  CM Consult  Post Acute Care Choice:    Choice offered to:  Patient  DME Arranged:  N/A DME Agency:  NA  HH Arranged:  NA, Patient Refused [place] Agency:  NA  Status of Service:  Completed, signed off  Medicare Important Message Given:    Date Medicare IM Given:    Medicare IM give by:    Date Additional Medicare IM Given:    Additional Medicare Important Message give by:     If discussed at [place] of Stay Meetings, dates discussed:    Additional Comments: CM met with pt in room to offer choice of home health agency.  Pt declines all [place] services as he states he has been through this before, knows what to expect, and feel comfortable with NO HHC.  Pt has both a rolling walker and 3n1 from previous surgery and family members.  No other CM needs were communicated. Dellie Catholic, RN 12/21/2015, 12:05 PM

## 2015-12-21 NOTE — Progress Notes (Signed)
Utilization review completed.  

## 2015-12-21 NOTE — Progress Notes (Signed)
.     Subjective: 1 Day Post-Op Procedure(s) (LRB): LEFT TOTAL HIP ARTHROPLASTY Partial REVISION (Left) Patient reports pain as mild and moderate.   Patient seen in rounds with Dr. Lequita Halt. Patient is well, but has had some minor complaints of pain in the hip and thigh, requiring pain medications We will start therapy today.  Hip precautions. WBAT to left leg. Plan is to go Home after hospital stay.  Objective: Vital signs in last 24 hours: Temp:  [97.5 F (36.4 C)-98.2 F (36.8 C)] 97.7 F (36.5 C) (02/23 0602) Pulse Rate:  [74-89] 87 (02/23 0602) Resp:  [10-22] 16 (02/23 0602) BP: (102-168)/(64-117) 141/75 mmHg (02/23 0602) SpO2:  [94 %-99 %] 97 % (02/23 0602) Weight:  [99.338 kg (219 lb)] 99.338 kg (219 lb) (02/22 1800)  Intake/Output from previous day:  Intake/Output Summary (Last 24 hours) at 12/21/15 0841 Last data filed at 12/21/15 5621  Gross per 24 hour  Intake   4340 ml  Output   2715 ml  Net   1625 ml    Intake/Output this shift: Total I/O In: 240 [P.O.:240] Out: -   Labs:  Recent Labs  12/21/15 0414  HGB 12.6*    Recent Labs  12/21/15 0414  WBC 13.3*  RBC 3.94*  HCT 35.6*  PLT 176    Recent Labs  12/21/15 0414  NA 135  K 4.6  CL 103  CO2 21*  BUN 15  CREATININE 0.90  GLUCOSE 346*  CALCIUM 8.3*   No results for input(s): LABPT, INR in the last 72 hours.  EXAM General - Patient is Alert, Appropriate and Oriented Extremity - Neurovascular intact Sensation intact distally Dorsiflexion/Plantar flexion intact Dressing - dressing C/D/I Motor Function - intact, moving foot and toes well on exam.  Hemovac pulled without difficulty.  Past Medical History  Diagnosis Date  . Kidney stone   . Diverticulitis   . Arthritis     Assessment/Plan: 1 Day Post-Op Procedure(s) (LRB): LEFT TOTAL HIP ARTHROPLASTY Partial REVISION (Left) Principal Problem:   Failed total hip arthroplasty (HCC)  Estimated body mass index is 32.33 kg/(m^2) as  calculated from the following:   Height as of this encounter:  (1.753 m).   Weight as of this encounter: 99.338 kg (219 lb). Advance diet Up with therapy Plan for discharge tomorrow Discharge home with home health  DVT Prophylaxis - Xarelto Weight Bearing As Tolerated left Leg D/C Knee Immobilizer Hemovac Pulled Begin Therapy Hip Preacutions HGB 12.6 postop day one  Avel Peace, PA-C Orthopaedic Surgery 12/21/2015, 8:41 AM

## 2015-12-21 NOTE — Evaluation (Signed)
Physical Therapy Evaluation Patient Details Name: Roy Becker. MRN: [id number] DOB: [birth year] Today's Date: 12/21/2015   History of Present Illness  s/p posterior L THA revision  Clinical Impression  Pt is s/p THA resulting in the deficits listed below (see PT Problem List).   Pt will benefit from skilled PT to increase their independence and safety with mobility to allow discharge to the venue listed below.      Follow Up Recommendations Home health PT;Supervision - Intermittent    Equipment Recommendations  None recommended by PT    Recommendations for Other Services       Precautions / Restrictions Precautions Precautions: Posterior Hip Precaution Comments: sign in room Restrictions Weight Bearing Restrictions: No Other Position/Activity Restrictions: WBAT      Mobility  Bed Mobility Overal bed mobility: Needs Assistance Bed Mobility: Supine to Sit     Supine to sit: HOB elevated;Min assist     General bed mobility comments: assist with LLE, cues for technique  Transfers Overall transfer level: Needs assistance Equipment used: Rolling walker (2 wheeled) Transfers: Sit to/from Stand Sit to Stand: Min assist         General transfer comment: cues for hand placement, LLE position/THP  Ambulation/Gait Ambulation/Gait assistance: Min assist;Min guard Ambulation Distance (Feet): 50 Feet Assistive device: Rolling walker (2 wheeled) Gait Pattern/deviations: Step-to pattern;Antalgic     General Gait Details: incr time, cues for sequence, RW distance from self;  pt takes 3 standing rest breaks  Stairs            Wheelchair Mobility    Modified Rankin (Stroke Patients Only)       Balance                                             Pertinent Vitals/Pain Pain Assessment: 0-10 Pain Score: 7  Pain Location: L hip Pain Descriptors / Indicators: Sore;Nagging Pain Intervention(s): Limited activity within patient's  tolerance;Monitored during session;Premedicated before session;Repositioned    Home Living Family/patient expects to be discharged to:: Private residence Living Arrangements: Spouse/significant other   Type of Home: House (townhouse) Home Access: Stairs to enter   Secretary/administrator of Steps: threshold only Home Layout: One level Home Equipment: Bedside commode;Tub bench;Walker - 2 wheels      Prior Function Level of Independence: Independent               Hand Dominance        Extremity/Trunk Assessment   Upper Extremity Assessment: Defer to OT evaluation           Lower Extremity Assessment: LLE deficits/detail   LLE Deficits / Details: hip flexion and knee 2+/5; ankle WFL     Communication   Communication: No difficulties  Cognition Arousal/Alertness: Awake/alert Behavior During Therapy: WFL for tasks assessed/performed Overall Cognitive Status: Within Functional Limits for tasks assessed                      General Comments      Exercises Total Joint Exercises Ankle Circles/Pumps: AROM;Both;10 reps Quad Sets: 5 reps;Both;Strengthening      Assessment/Plan    PT Assessment Patient needs continued PT services  PT Diagnosis Difficulty walking   PT Problem List Decreased strength;Decreased activity tolerance;Decreased mobility;Decreased knowledge of use of DME;Pain;Decreased knowledge of precautions  PT Treatment Interventions DME instruction;Gait training;Functional mobility training;Therapeutic activities;Therapeutic  exercise;Patient/family education   PT Goals (Current goals can be found in the Care Plan section) Acute Rehab PT Goals Patient Stated Goal: to get back to life PT Goal Formulation: With patient Time For Goal Achievement: 12/28/15 Potential to Achieve Goals: Good    Frequency 7X/week   Barriers to discharge        Co-evaluation               End of Session Equipment Utilized During Treatment: Gait  belt Activity Tolerance: Patient tolerated treatment well Patient left: with call bell/phone within reach;in chair Nurse Communication: Mobility status         Time: 0865-7846 PT Time Calculation (min) (ACUTE ONLY): 35 min   Charges:   PT Evaluation $PT Eval Low Complexity: 1 Procedure PT Treatments $Gait Training: 8-22 mins   PT G Codes:        Roy Becker 2016/01/04, 10:13 AM

## 2015-12-22 LAB — CBC
HCT: 32.5 % — ABNORMAL LOW (ref 39.0–52.0)
Hemoglobin: 11.5 g/dL — ABNORMAL LOW (ref 13.0–17.0)
MCH: 32.3 pg (ref 26.0–34.0)
MCHC: 35.4 g/dL (ref 30.0–36.0)
MCV: 91.3 fL (ref 78.0–100.0)
PLATELETS: 182 10*3/uL (ref 150–400)
RBC: 3.56 MIL/uL — ABNORMAL LOW (ref 4.22–5.81)
RDW: 12.9 % (ref 11.5–15.5)
WBC: 14.3 10*3/uL — ABNORMAL HIGH (ref 4.0–10.5)

## 2015-12-22 LAB — BASIC METABOLIC PANEL
Anion gap: 8 (ref 5–15)
BUN: 20 mg/dL (ref 6–20)
CALCIUM: 8.3 mg/dL — AB (ref 8.9–10.3)
CO2: 27 mmol/L (ref 22–32)
CREATININE: 1.01 mg/dL (ref 0.61–1.24)
Chloride: 100 mmol/L — ABNORMAL LOW (ref 101–111)
GFR calc Af Amer: >60 mL/min (ref >60)
GLUCOSE: 255 mg/dL — AB (ref 65–99)
Potassium: 4.1 mmol/L (ref 3.5–5.1)
SODIUM: 135 mmol/L (ref 135–145)

## 2015-12-22 MED ORDER — METHOCARBAMOL 500 MG PO TABS: 500.0000 mg | ORAL_TABLET | Freq: Four times a day (QID) | ORAL | Status: DC | PRN

## 2015-12-22 MED ORDER — OXYCODONE HCL 5 MG PO TABS: 5.0000 mg | ORAL_TABLET | ORAL | Status: DC | PRN

## 2015-12-22 MED ORDER — RIVAROXABAN 10 MG PO TABS: 10.0000 mg | ORAL_TABLET | Freq: Every day | ORAL | Status: DC

## 2015-12-22 MED ORDER — TRAMADOL HCL 50 MG PO TABS: 50.0000 mg | ORAL_TABLET | Freq: Four times a day (QID) | ORAL | Status: AC | PRN

## 2015-12-22 NOTE — Progress Notes (Signed)
Physical Therapy Treatment Patient Details Name: Roy Becker. MRN: [id number] DOB: 1951/06/22 Today's Date: 12/22/2015    History of Present Illness s/p posterior L THA revision    PT Comments    POD # 2  Assisted OOB to amb in hallway with increased time.  Very slow gait.  Assisted to recliner and performed some THR TE's before assisting to bathroom.  Pt has no steps to enter one level [facility].  Pt ready for D/C to home.   Follow Up Recommendations  Home health PT;Supervision - Intermittent     Equipment Recommendations  None recommended by PT    Recommendations for Other Services       Precautions / Restrictions Precautions Precautions: Posterior Hip Precaution Comments: pt recalls 3/3 THP Restrictions Weight Bearing Restrictions: No Other Position/Activity Restrictions: WBAT    Mobility  Bed Mobility Overal bed mobility: Needs Assistance Bed Mobility: Supine to Sit     Supine to sit: Min guard     General bed mobility comments: increased time and MinGuard Assist to guide L LE accross and off bed   Transfers Overall transfer level: Needs assistance Equipment used: Rolling walker (2 wheeled) Transfers: Sit to/from Stand Sit to Stand: Supervision         General transfer comment: increased time, good tech to adhere to THP  Ambulation/Gait Ambulation/Gait assistance: Supervision;Min guard Ambulation Distance (Feet): 185 Feet Assistive device: Rolling walker (2 wheeled) Gait Pattern/deviations: Step-to pattern;Decreased stance time - left;Decreased step length - right;Decreased step length - left;Trunk flexed Gait velocity: decr   General Gait Details: incr time, cues for sequence, RW distance from self;  pt takes multiple standing rest breaks   Stairs            Wheelchair Mobility    Modified Rankin (Stroke Patients Only)       Balance                                    Cognition Arousal/Alertness:  Awake/alert Behavior During Therapy: WFL for tasks assessed/performed Overall Cognitive Status: Within Functional Limits for tasks assessed                      Exercises  b LE AP A LE knee presses      General Comments        Pertinent Vitals/Pain Pain Assessment: 0-10 Pain Score: 4  Pain Location: L hip Pain Descriptors / Indicators: Sore;Tightness Pain Intervention(s): Monitored during session;Premedicated before session;Repositioned;Ice applied    Home Living                      Prior Function            PT Goals (current goals can now be found in the care plan section) Progress towards PT goals: Progressing toward goals    Frequency  7X/week    PT Plan Current plan remains appropriate    Co-evaluation             End of Session Equipment Utilized During Treatment: Gait belt Activity Tolerance: Patient tolerated treatment well Patient left: in chair;with call bell/phone within reach     Time: 0930-1010 PT Time Calculation (min) (ACUTE ONLY): 40 min  Charges:  $Gait Training: 8-22 mins $Therapeutic Exercise: 8-22 mins $Therapeutic Activity: 8-22 mins  G Codes:      Rica Koyanagi  PTA [facility]  Acute  Rehab Pager      [phone]
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
## 2015-12-22 NOTE — Progress Notes (Signed)
   Subjective: 2 Days Post-Op Procedure(s) (LRB): LEFT TOTAL HIP ARTHROPLASTY Partial REVISION (Left) Patient reports pain as mild.   Patient seen in rounds by Dr. Lequita Halt. Patient is well, and has had no acute complaints or problems Patient is ready to go home  Objective: Vital signs in last 24 hours: Temp:  [97.7 F (36.5 C)-98.4 F (36.9 C)] 97.7 F (36.5 C) (02/24 1330) Pulse Rate:  [80-95] 88 (02/24 1330) Resp:  [16-20] 18 (02/24 1330) BP: (133-163)/(56-69) 133/60 mmHg (02/24 1330) SpO2:  [95 %-100 %] 100 % (02/24 1330)  Intake/Output from previous day:  Intake/Output Summary (Last 24 hours) at 12/22/15 1549 Last data filed at 12/22/15 1330  Gross per 24 hour  Intake    960 ml  Output    325 ml  Net    635 ml    Intake/Output this shift: Total I/O In: 480 [P.O.:480] Out: -   Labs:  Recent Labs  12/21/15 0414 12/22/15 0426  HGB 12.6* 11.5*    Recent Labs  12/21/15 0414 12/22/15 0426  WBC 13.3* 14.3*  RBC 3.94* 3.56*  HCT 35.6* 32.5*  PLT 176 182    Recent Labs  12/21/15 0414 12/22/15 0426  NA 135 135  K 4.6 4.1  CL 103 100*  CO2 21* 27  BUN 15 20  CREATININE 0.90 1.01  GLUCOSE 346* 255*  CALCIUM 8.3* 8.3*   No results for input(s): LABPT, INR in the last 72 hours.  EXAM: General - Patient is Alert, Appropriate and Oriented Extremity - Neurovascular intact Sensation intact distally Dorsiflexion/Plantar flexion intact Incision - clean, dry, no drainage Motor Function - intact, moving foot and toes well on exam.   Assessment/Plan: 2 Days Post-Op Procedure(s) (LRB): LEFT TOTAL HIP ARTHROPLASTY Partial REVISION (Left) Procedure(s) (LRB): LEFT TOTAL HIP ARTHROPLASTY Partial REVISION (Left) Past Medical History  Diagnosis Date  . Kidney stone   . Diverticulitis   . Arthritis    Principal Problem:   Failed total hip arthroplasty ([facility])  Estimated body mass index is 32.33 kg/(m^2) as calculated from the following:   Height as of  this encounter:  (1.753 m).   Weight as of this encounter: 99.338 kg (219 lb). Advance diet Up with therapy Discharge home with home health Diet - Cardiac diet Follow up - in 2 weeks Activity - WBAT Disposition - Home Condition Upon Discharge - Good D/C Meds - See DC Summary DVT Prophylaxis - Xarelto  Avel Peace, PA-C Orthopaedic Surgery 12/22/2015, 3:49 PM

## 2016-01-31 DIAGNOSIS — M25552 Pain in left hip: Secondary | ICD-10-CM | POA: Diagnosis not present

## 2016-02-05 DIAGNOSIS — M25552 Pain in left hip: Secondary | ICD-10-CM | POA: Diagnosis not present

## 2016-02-07 DIAGNOSIS — M25552 Pain in left hip: Secondary | ICD-10-CM | POA: Diagnosis not present

## 2016-02-14 DIAGNOSIS — M25552 Pain in left hip: Secondary | ICD-10-CM | POA: Diagnosis not present

## 2016-02-15 ENCOUNTER — Encounter (HOSPITAL_COMMUNITY)
Admission: EM | Disposition: A | Discharge status: Home / Self Care | Payer: Self-pay | Source: Home / Self Care | Attending: Emergency Medicine

## 2016-02-15 ENCOUNTER — Emergency Department (HOSPITAL_COMMUNITY): Payer: Federal, State, Local not specified - PPO | Admitting: Anesthesiology

## 2016-02-15 ENCOUNTER — Emergency Department (HOSPITAL_COMMUNITY): Payer: Federal, State, Local not specified - PPO

## 2016-02-15 ENCOUNTER — Observation Stay (HOSPITAL_COMMUNITY)
Admission: EM | Admit: 2016-02-15 | Discharge: 2016-02-16 | Disposition: A | Discharge status: Home / Self Care | Payer: Federal, State, Local not specified - PPO | Attending: Orthopedic Surgery | Admitting: Orthopedic Surgery

## 2016-02-15 ENCOUNTER — Encounter (HOSPITAL_COMMUNITY): Payer: Self-pay | Admitting: *Deleted

## 2016-02-15 DIAGNOSIS — Z96641 Presence of right artificial hip joint: Secondary | ICD-10-CM | POA: Insufficient documentation

## 2016-02-15 DIAGNOSIS — S73005A Unspecified dislocation of left hip, initial encounter: Secondary | ICD-10-CM

## 2016-02-15 DIAGNOSIS — S73015A Posterior dislocation of left hip, initial encounter: Secondary | ICD-10-CM | POA: Diagnosis not present

## 2016-02-15 DIAGNOSIS — Z9049 Acquired absence of other specified parts of digestive tract: Secondary | ICD-10-CM | POA: Insufficient documentation

## 2016-02-15 DIAGNOSIS — Z7982 Long term (current) use of aspirin: Secondary | ICD-10-CM | POA: Insufficient documentation

## 2016-02-15 DIAGNOSIS — T84021A Dislocation of internal left hip prosthesis, initial encounter: Principal | ICD-10-CM | POA: Insufficient documentation

## 2016-02-15 DIAGNOSIS — T8189XA Other complications of procedures, not elsewhere classified, initial encounter: Secondary | ICD-10-CM | POA: Diagnosis not present

## 2016-02-15 DIAGNOSIS — Z471 Aftercare following joint replacement surgery: Secondary | ICD-10-CM | POA: Diagnosis not present

## 2016-02-15 DIAGNOSIS — Z419 Encounter for procedure for purposes other than remedying health state, unspecified: Secondary | ICD-10-CM

## 2016-02-15 DIAGNOSIS — T8131XA Disruption of external operation (surgical) wound, not elsewhere classified, initial encounter: Secondary | ICD-10-CM | POA: Diagnosis not present

## 2016-02-15 DIAGNOSIS — Z87891 Personal history of nicotine dependence: Secondary | ICD-10-CM | POA: Diagnosis not present

## 2016-02-15 DIAGNOSIS — Z96649 Presence of unspecified artificial hip joint: Secondary | ICD-10-CM

## 2016-02-15 DIAGNOSIS — T84028A Dislocation of other internal joint prosthesis, initial encounter: Secondary | ICD-10-CM

## 2016-02-15 DIAGNOSIS — Y831 Surgical operation with implant of artificial internal device as the cause of abnormal reaction of the patient, or of later complication, without mention of misadventure at the time of the procedure: Secondary | ICD-10-CM | POA: Insufficient documentation

## 2016-02-15 DIAGNOSIS — Z96642 Presence of left artificial hip joint: Secondary | ICD-10-CM | POA: Diagnosis not present

## 2016-02-15 HISTORY — PX: HIP CLOSED REDUCTION: SHX983

## 2016-02-15 LAB — CBC
HEMATOCRIT: 40 % (ref 39.0–52.0)
Hemoglobin: 13.6 g/dL (ref 13.0–17.0)
MCH: 29.2 pg (ref 26.0–34.0)
MCHC: 34 g/dL (ref 30.0–36.0)
MCV: 86 fL (ref 78.0–100.0)
PLATELETS: 168 10*3/uL (ref 150–400)
RBC: 4.65 MIL/uL (ref 4.22–5.81)
RDW: 13.2 % (ref 11.5–15.5)
WBC: 7.3 10*3/uL (ref 4.0–10.5)

## 2016-02-15 LAB — CREATININE, SERUM
Creatinine, Ser: 0.72 mg/dL (ref 0.61–1.24)
GFR calc non Af Amer: >60 mL/min (ref >60)

## 2016-02-15 SURGERY — CLOSED REDUCTION, HIP
Anesthesia: General | Laterality: Left

## 2016-02-15 MED ORDER — PROPOFOL 10 MG/ML IV BOLUS
INTRAVENOUS | Status: AC
  Filled 2016-02-15: qty 20

## 2016-02-15 MED ORDER — FENTANYL CITRATE (PF) 100 MCG/2ML IJ SOLN
25.0000 ug | INTRAMUSCULAR | Status: DC | PRN
Start: 2016-02-15 — End: 2016-02-15
  Administered 2016-02-15: 50 ug via INTRAVENOUS
  Administered 2016-02-15 (×2): 25 ug via INTRAVENOUS

## 2016-02-15 MED ORDER — MELOXICAM 15 MG PO TABS
15.0000 mg | ORAL_TABLET | Freq: Every day | ORAL | Status: DC
  Administered 2016-02-16: 15 mg via ORAL
  Filled 2016-02-15: qty 1

## 2016-02-15 MED ORDER — LIDOCAINE HCL (CARDIAC) 20 MG/ML IV SOLN
INTRAVENOUS | Status: AC
  Filled 2016-02-15: qty 5

## 2016-02-15 MED ORDER — ASPIRIN EC 81 MG PO TBEC
81.0000 mg | DELAYED_RELEASE_TABLET | Freq: Every day | ORAL | Status: DC
  Administered 2016-02-16: 81 mg via ORAL
  Filled 2016-02-15: qty 1

## 2016-02-15 MED ORDER — BUPROPION HCL ER (XL) 300 MG PO TB24
300.0000 mg | ORAL_TABLET | Freq: Every day | ORAL | Status: DC
  Administered 2016-02-15 – 2016-02-16 (×2): 300 mg via ORAL
  Filled 2016-02-15 (×2): qty 1

## 2016-02-15 MED ORDER — TRAMADOL HCL 50 MG PO TABS: 50.0000 mg | ORAL_TABLET | Freq: Four times a day (QID) | ORAL | Status: DC | PRN

## 2016-02-15 MED ORDER — PROPOFOL 10 MG/ML IV BOLUS
INTRAVENOUS | Status: DC | PRN
  Administered 2016-02-15: 200 mg via INTRAVENOUS

## 2016-02-15 MED ORDER — METHOCARBAMOL 500 MG PO TABS
500.0000 mg | ORAL_TABLET | Freq: Four times a day (QID) | ORAL | Status: DC | PRN
  Administered 2016-02-15 – 2016-02-16 (×2): 500 mg via ORAL
  Filled 2016-02-15 (×2): qty 1

## 2016-02-15 MED ORDER — ONDANSETRON HCL 4 MG/2ML IJ SOLN: 4.0000 mg | Freq: Four times a day (QID) | INTRAMUSCULAR | Status: DC | PRN

## 2016-02-15 MED ORDER — ONDANSETRON HCL 4 MG/2ML IJ SOLN
INTRAMUSCULAR | Status: DC | PRN
  Administered 2016-02-15: 4 mg via INTRAVENOUS

## 2016-02-15 MED ORDER — CARVEDILOL 25 MG PO TABS
25.0000 mg | ORAL_TABLET | Freq: Two times a day (BID) | ORAL | Status: DC
  Administered 2016-02-15 – 2016-02-16 (×2): 25 mg via ORAL
  Filled 2016-02-15 (×4): qty 1

## 2016-02-15 MED ORDER — TEMAZEPAM 15 MG PO CAPS: 15.0000 mg | ORAL_CAPSULE | Freq: Every evening | ORAL | Status: DC | PRN

## 2016-02-15 MED ORDER — LIDOCAINE HCL (CARDIAC) 20 MG/ML IV SOLN
INTRAVENOUS | Status: DC | PRN
  Administered 2016-02-15: 100 mg via INTRATRACHEAL

## 2016-02-15 MED ORDER — HYDROCODONE-ACETAMINOPHEN 5-325 MG PO TABS
1.0000 | ORAL_TABLET | ORAL | Status: DC | PRN
  Administered 2016-02-15: 2 via ORAL
  Administered 2016-02-16: 1 via ORAL
  Filled 2016-02-15: qty 2
  Filled 2016-02-15: qty 1

## 2016-02-15 MED ORDER — AMITRIPTYLINE HCL 10 MG PO TABS
10.0000 mg | ORAL_TABLET | Freq: Every day | ORAL | Status: DC | PRN
  Filled 2016-02-15: qty 1

## 2016-02-15 MED ORDER — LACTATED RINGERS IV SOLN: INTRAVENOUS | Status: DC

## 2016-02-15 MED ORDER — LACTATED RINGERS IV SOLN
INTRAVENOUS | Status: DC | PRN
  Administered 2016-02-15: 14:00:00 via INTRAVENOUS

## 2016-02-15 MED ORDER — METHOCARBAMOL 500 MG PO TABS: 500.0000 mg | ORAL_TABLET | Freq: Four times a day (QID) | ORAL | Status: DC | PRN

## 2016-02-15 MED ORDER — METOCLOPRAMIDE HCL 5 MG/ML IJ SOLN: 5.0000 mg | Freq: Three times a day (TID) | INTRAMUSCULAR | Status: DC | PRN

## 2016-02-15 MED ORDER — ENOXAPARIN SODIUM 30 MG/0.3ML ~~LOC~~ SOLN
30.0000 mg | SUBCUTANEOUS | Status: DC
  Administered 2016-02-16: 30 mg via SUBCUTANEOUS
  Filled 2016-02-15 (×2): qty 0.3

## 2016-02-15 MED ORDER — ONDANSETRON HCL 4 MG PO TABS: 4.0000 mg | ORAL_TABLET | Freq: Four times a day (QID) | ORAL | Status: DC | PRN

## 2016-02-15 MED ORDER — HYDROMORPHONE HCL 1 MG/ML IJ SOLN: 0.5000 mg | INTRAMUSCULAR | Status: DC | PRN

## 2016-02-15 MED ORDER — FOLIC ACID 1 MG PO TABS
1.0000 mg | ORAL_TABLET | Freq: Every day | ORAL | Status: DC
  Administered 2016-02-16: 1 mg via ORAL
  Filled 2016-02-15: qty 1

## 2016-02-15 MED ORDER — POLYETHYLENE GLYCOL 3350 17 G PO PACK: 17.0000 g | PACK | Freq: Every day | ORAL | Status: DC | PRN

## 2016-02-15 MED ORDER — METHOCARBAMOL 1000 MG/10ML IJ SOLN
500.0000 mg | Freq: Four times a day (QID) | INTRAVENOUS | Status: DC | PRN
  Filled 2016-02-15: qty 5

## 2016-02-15 MED ORDER — FLEET ENEMA 7-19 GM/118ML RE ENEM: 1.0000 | ENEMA | Freq: Once | RECTAL | Status: DC | PRN

## 2016-02-15 MED ORDER — ONDANSETRON HCL 4 MG/2ML IJ SOLN
INTRAMUSCULAR | Status: AC
  Filled 2016-02-15: qty 2

## 2016-02-15 MED ORDER — HYDROMORPHONE HCL 1 MG/ML IJ SOLN
1.0000 mg | Freq: Once | INTRAMUSCULAR | Status: AC
  Administered 2016-02-15: 1 mg via INTRAVENOUS
  Filled 2016-02-15: qty 1

## 2016-02-15 MED ORDER — BISACODYL 5 MG PO TBEC: 5.0000 mg | DELAYED_RELEASE_TABLET | Freq: Every day | ORAL | Status: DC | PRN

## 2016-02-15 MED ORDER — ACETAMINOPHEN 325 MG PO TABS: 650.0000 mg | ORAL_TABLET | Freq: Four times a day (QID) | ORAL | Status: DC | PRN

## 2016-02-15 MED ORDER — ACETAMINOPHEN 650 MG RE SUPP: 650.0000 mg | Freq: Four times a day (QID) | RECTAL | Status: DC | PRN

## 2016-02-15 MED ORDER — FENTANYL CITRATE (PF) 100 MCG/2ML IJ SOLN
INTRAMUSCULAR | Status: AC
  Filled 2016-02-15: qty 2

## 2016-02-15 MED ORDER — METOCLOPRAMIDE HCL 10 MG PO TABS: 5.0000 mg | ORAL_TABLET | Freq: Three times a day (TID) | ORAL | Status: DC | PRN

## 2016-02-15 SURGICAL SUPPLY — 2 items
IMMOBILIZER KNEE 20 (SOFTGOODS) ×2
IMMOBILIZER KNEE 20 THIGH 36 (SOFTGOODS) IMPLANT

## 2016-02-15 NOTE — Op Note (Signed)
NAMAlvina Chou:  Miyazaki, Marshun              ACCOUNT NO.:  [id number]  MEDICAL RECORD NO.:  [id number]  LOCATION:  [facility]                         FACILITY:  [facility]  PHYSICIAN:  Georges Lynchonald A. Nemesis Rainwater, M.D.DATE OF BIRTH:  28-Dec-1950  DATE OF PROCEDURE:  02/15/2016 DATE OF DISCHARGE:                              OPERATIVE REPORT   SURGEON:  Georges Lynchonald A. Darrelyn HillockGioffre, M.D.  ASSISTANT:  Dimitri PedAmber Constable, GeorgiaPA  PREOPERATIVE DIAGNOSES: 1. Previous total hip with fracture fragments with wire fixation. 2. Closed posterior dislocation of the left total hip.  POSTOPERATIVE DIAGNOSES: 1. Previous total hip with fracture fragments with wire fixation. 2. Closed posterior dislocation of the left total hip.  OPERATION:  Closed reduction of a posterior dislocation, left total hip.  DESCRIPTION OF PROCEDURE:  Under general anesthesia, the appropriate time-out was carried out first.  I also marked the appropriate left hip in the holding area.  At this time, a gentle closed manipulation of the left hip was carried out.  The hip was reduced.  C-arm was in the room and verified anatomical position.  The leg lengths remained equal.  I then place him in a knee immobilizer and he will be admitted to the hospital mainly on the basis of the instability of his hip and the fracture fragments that were present free reduction.          ______________________________ Georges Lynchonald A. Darrelyn HillockGioffre, M.D.     RAG/MEDQ  D:  02/15/2016  T:  02/15/2016  Job:  [id number]

## 2016-02-15 NOTE — Anesthesia Postprocedure Evaluation (Signed)
Anesthesia Post Note  Patient: Roy MainlandBillie J Mccord Jr.  Procedure(s) Performed: Procedure(s) (LRB): CLOSED REDUCTION HIP (Left)  Patient location during evaluation: PACU Anesthesia Type: General Level of consciousness: sedated Pain management: satisfactory to patient Vital Signs Assessment: post-procedure vital signs reviewed and stable Respiratory status: spontaneous breathing Cardiovascular status: stable Anesthetic complications: no    Last Vitals:  Filed Vitals:   02/15/16 1700 02/15/16 1800  BP: 167/64 150/65  Pulse: 68 70  Temp: 37.1 C 36.6 C  Resp: 20 16    Last Pain:  Filed Vitals:   02/15/16 1820  PainSc: 4                  Remell Giaimo EDWARD

## 2016-02-15 NOTE — ED Notes (Signed)
Per EMS, pt from home, woke up this am with his L hip feeling "different". He has had 3 surgeries in each hip.  EMS reports L leg shortening noted.  Pt's BP is elevated.  Fentanyl given IVP

## 2016-02-15 NOTE — ED Notes (Signed)
Bed: [id number] Expected date:  Expected time:  Means of arrival:  Comments: EMS- hip dislocation

## 2016-02-15 NOTE — ED Notes (Signed)
While changing Pt into a gown for surgery, Pt directed this RN to cut off his underwear.  All other belonging placed in Pt Belonging bag.

## 2016-02-15 NOTE — Progress Notes (Signed)
Noted pt for admission  EPIC notes reviewed [facility]  [facility] now [facility] Pt in agreement of having [facility] to follow him for any d/c needs Cm made Jorja Loaim of [facility] aware of need to follow pt for d/c needs

## 2016-02-15 NOTE — ED Notes (Signed)
Pt presents w/ possible L hip dislocation.  Pt reports surgery x 3 on hip.  Sts it has been replaced twice.  Pt is followed by [name] MD.

## 2016-02-15 NOTE — ED Provider Notes (Signed)
CSN: [id number]     Arrival date & time 02/15/16  4098 History   First MD Initiated Contact with Patient 02/15/16 [phone]     Chief Complaint  Patient presents with  . Hip Pain     (Consider location/radiation/quality/duration/timing/severity/associated sxs/prior Treatment) HPI Comments: Patient is a 65 y/o male with a history of multiple hip arthroplasty with his last replacement of his left hip on 12/20/15. He presents to the ED via EMS with left hip pain since 0800. He states he was getting up from his recliner when he felt pain and his hip pop. He is having constant 5/10 pain, non-radiating, intermittently sharp. No associated numbness/tingling or weakness. Denies fever, chills, nausea, vomiting. EMS gave pain meds in transit.  Patient is a 65 y.o. male presenting with hip pain. The history is provided by the patient and the spouse.  Hip Pain Associated symptoms include arthralgias. Pertinent negatives include no chest pain, chills, fever, headaches, numbness, rash or weakness.    Past Medical History  Diagnosis Date  . Kidney stone   . Diverticulitis   . Arthritis    Past Surgical History  Procedure Laterality Date  . Joint replacement      right hip replacement- x 2 , left hip replacement x 1   . Back surgery      lumbar laminectomy   . Cholecystectomy  1989  . Total hip revision Left 12/20/2015    Procedure: LEFT TOTAL HIP ARTHROPLASTY Partial REVISION;  Surgeon: Ollen Gross, MD;  Location: [facility];  Service: Orthopedics;  Laterality: Left;   No family history on file. Social History  Substance Use Topics  . Smoking status: Former Smoker    Quit date: 10/28/2010  . Smokeless tobacco: Never Used     Comment: uses vapor some per patient   . Alcohol Use: No     Comment: hx of 20 years ago     Review of Systems  Constitutional: Negative for fever and chills.  Respiratory: Negative for shortness of breath.   Cardiovascular: Negative for chest pain and leg swelling.   Musculoskeletal: Positive for arthralgias and gait problem.  Skin: Negative for rash.  Neurological: Negative for dizziness, syncope, weakness, light-headedness, numbness and headaches.  All other systems reviewed and are negative.     Allergies  Contrast media and Eggs or egg-derived products  Home Medications   Prior to Admission medications   Medication Sig Start Date End Date Taking? Authorizing Provider  amitriptyline (ELAVIL) 10 MG tablet Take 10 mg by mouth daily as needed for sleep (stomach irritation).  03/10/15  Yes Historical Provider, MD  aspirin EC 81 MG tablet Take 81 mg by mouth daily.   Yes Historical Provider, MD  buPROPion (WELLBUTRIN XL) 300 MG 24 hr tablet Take 300 mg by mouth daily. 03/05/15  Yes Historical Provider, MD  carvedilol (COREG) 25 MG tablet Take 25 mg by mouth 2 (two) times daily with a meal.   Yes Historical Provider, MD  COSENTYX SENSOREADY PEN 150 MG/ML SOAJ Inject 150 mg into the skin every 28 (twenty-eight) days. 01/12/16  Yes Historical Provider, MD  folic acid (FOLVITE) 1 MG tablet Take 1 mg by mouth daily. 01/31/16  Yes Historical Provider, MD  meloxicam (MOBIC) 15 MG tablet Take 15 mg by mouth daily as needed for pain.  01/17/16  Yes Historical Provider, MD  temazepam (RESTORIL) 15 MG capsule Take 15-30 mg by mouth at bedtime as needed for sleep.  03/14/15  Yes Historical Provider, MD  traMADol (ULTRAM) 50 MG tablet Take 1-2 tablets (50-100 mg total) by mouth every 6 (six) hours as needed (mild pain). Patient taking differently: Take 100 mg by mouth every 6 (six) hours as needed for moderate pain.  12/22/15  Yes Avel Peacerew Perkins, PA-C  methocarbamol (ROBAXIN) 500 MG tablet Take 1 tablet (500 mg total) by mouth every 6 (six) hours as needed for muscle spasms. Patient not taking: Reported on 02/15/2016 12/22/15   Avel Peacerew Perkins, PA-C  oxyCODONE (OXY IR/ROXICODONE) 5 MG immediate release tablet Take 1-2 tablets (5-10 mg total) by mouth every 3 (three) hours as  needed for moderate pain or severe pain. Patient not taking: Reported on 02/15/2016 12/22/15   Avel Peacerew Perkins, PA-C  rivaroxaban (XARELTO) 10 MG TABS tablet Take 1 tablet (10 mg total) by mouth daily with breakfast. Take Xarelto for two and a half more weeks, then discontinue Xarelto. Once the patient has completed the blood thinner regimen, then take a Baby 81 mg Aspirin daily for three more weeks. Patient not taking: Reported on 02/15/2016 12/22/15   Avel Peacerew Perkins, PA-C   BP 181/76 mmHg  Pulse 71  Temp(Src) 97.7 F (36.5 C) (Oral)  Resp 16  Wt 96.163 kg  SpO2 96% Physical Exam  Constitutional: He appears well-developed and well-nourished. No distress.  HENT:  Head: Normocephalic and atraumatic.  Eyes: Conjunctivae are normal.  Cardiovascular: Normal rate, regular rhythm, normal heart sounds and intact distal pulses.   Pulmonary/Chest: Effort normal and breath sounds normal.  Musculoskeletal: He exhibits no edema.  Limited ROM of left leg due to pain, no echymosis, redness, or edema noted to left hip  Neurological: He is alert. Coordination normal.  Skin: Skin is warm and dry.    ED Course  Procedures (including critical care time) Labs Review Labs Reviewed - No data to display  Imaging Review Dg Hip Unilat With Pelvis 2-3 Views Left  02/15/2016  CLINICAL DATA:  Left hip dislocation. 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 The left femoral head prosthesis is dislocated posteriorly and superiorly. Incomplete healing of the subtrochanteric fracture and lucency around the prosthesis is again demonstrated. IMPRESSION: Posterior superior dislocation of the left femoral head prosthesis. Incomplete healing of the subtrochanteric fracture and slight progression of lucency around the femoral prosthesis above the cerclage wire. Electronically Signed   By: Rudie MeyerP.  Gallerani M.D.   On: 02/15/2016 10:12   I have  personally reviewed and evaluated these images and lab results as part of my medical decision-making.    MDM   Final diagnoses:  Hip dislocation, left, initial encounter (HCC)    Afebrile, well appearing patient with a left total hip arthroplasty which is posteriorly superiorly dislocated. Patient is requesting Dr. Lequita HaltAluisio reduce his dislocation. Dr. Joni FearsLui asked that I will call Dr. Lequita HaltAluisio because of the not healed subtrochanteric fracture.  Amber Hoodonstable, PA-C from [facility] called and she told me they were taking the patient to the OR to reduce his dislocation. She requested I make sure the pt remain NPO. Pain was well controlled while in the ED.      Jerre SimonJessica L Leniyah Martell, PA 02/15/16 1241  Lavera Guiseana Duo Liu, MD 02/16/16 [phone]

## 2016-02-15 NOTE — Anesthesia Procedure Notes (Signed)
Procedure Name: LMA Insertion Date/Time: 02/15/2016 2:21 PM Performed by: Delphia GratesHANDLER, Aubria Vanecek Pre-anesthesia Checklist: Patient identified, Emergency Drugs available, Suction available and Patient being monitored Patient Re-evaluated:Patient Re-evaluated prior to inductionOxygen Delivery Method: Circle system utilized Preoxygenation: Pre-oxygenation with 100% oxygen Intubation Type: IV induction LMA Size: 4.0 Number of attempts: 1 Placement Confirmation: breath sounds checked- equal and bilateral and positive ETCO2 Tube secured with: Tape Dental Injury: Teeth and Oropharynx as per pre-operative assessment

## 2016-02-15 NOTE — Brief Op Note (Signed)
02/15/2016  2:37 PM  PATIENT:  Roy MainlandBillie J Thelen Jr.  65 y.o. male  PRE-OPERATIVE DIAGNOSIS:Closed Posterior  dislocated left hip  POST-OPERATIVE DIAGNOSIS:  Same as Pre-Op  PROCEDURE:  Procedure(s): CLOSED REDUCTION HIP (Left) Total Hip  SURGEON:  Surgeon(s) and Role:    * Ranee Gosselinonald Zavion Sleight, MD - Primary  PHYSICIAN ASSISTANT: Dimitri PedAmber Constable PA  ASSISTANTS: {ASSISTANTS:[id number] Constable PA  ANESTHESIA:   general  EBL:  Total I/O In: 300 [I.V.:300] Out: -   BLOOD ADMINISTERED:none  DRAINS: none   LOCAL MEDICATIONS USED:  None  SPECIMEN:  No Specimen  DISPOSITION OF SPECIMEN:  N/A  COUNTS:  YES and ACTION TAKEN: None  TOURNIQUET:  * No tourniquets in log *  DICTATION: .Other Dictation: Dictation Number [id number]  PLAN OF CARE: Admit for overnight observation  PATIENT DISPOSITION:  PACU - hemodynamically stable.   Delay start of Pharmacological VTE agent (>24hrs) due to surgical blood loss or risk of bleeding: yes

## 2016-02-15 NOTE — Anesthesia Preprocedure Evaluation (Signed)
Anesthesia Evaluation  Patient identified by MRN, date of birth, ID band Patient awake    Reviewed: Allergy & Precautions, H&P , Patient's Chart, lab work & pertinent test results, reviewed documented beta blocker date and time   Airway Mallampati: II  TM Distance: >3 FB Neck ROM: full    Dental no notable dental hx.    Pulmonary former smoker,    Pulmonary exam normal breath sounds clear to auscultation       Cardiovascular  Rhythm:regular Rate:Normal     Neuro/Psych    GI/Hepatic   Endo/Other    Renal/GU      Musculoskeletal   Abdominal   Peds  Hematology   Anesthesia Other Findings   Reproductive/Obstetrics                             Anesthesia Physical Anesthesia Plan  ASA: II  Anesthesia Plan:    Post-op Pain Management:    Induction: Intravenous  Airway Management Planned: LMA  Additional Equipment:   Intra-op Plan:   Post-operative Plan:   Informed Consent: I have reviewed the patients History and Physical, chart, labs and discussed the procedure including the risks, benefits and alternatives for the proposed anesthesia with the patient or authorized representative who has indicated his/her understanding and acceptance.   Dental Advisory Given and Dental advisory given  Plan Discussed with: CRNA and Surgeon  Anesthesia Plan Comments: (May give sux after LMA placed if surgeon needs relaxation Discussed GA with LMA, possible sore throat, potential need to switch to ETT, N/V, pulmonary aspiration. Questions answered. )        Anesthesia Quick Evaluation

## 2016-02-15 NOTE — H&P (Signed)
Roy CaprioBillie J Leisa LenzFortson Jr. is an 65 y.o. male.   Chief Complaint: left hip pain HPI: The patient presented with left hip pain this morning. He is present with his wife in the ED. He reports that he was getting out of his recliner at home this morning and felt some pain and "something slip" in his left hip. He has not been able to ambulate as a result of the left hip pain. He feels that the left leg is shorter. No falls. He is about 8 weeks post op from a left hip acetabular revision by Dr. Lequita Becker. He did have a periprosthetic fracture around the femoral component which was cabled and secured during that operation. He has been progressing well since his surgery. Has not eaten since midnight last night.   Past Medical History  Diagnosis Date  . Kidney stone   . Diverticulitis   . Arthritis     Past Surgical History  Procedure Laterality Date  . Joint replacement      right hip replacement- x 2 , left hip replacement x 1   . Back surgery      lumbar laminectomy   . Cholecystectomy  1989  . Total hip revision Left 12/20/2015    Procedure: LEFT TOTAL HIP ARTHROPLASTY Partial REVISION;  Surgeon: Roy GrossFrank [name], MD;  Location: [facility];  Service: Orthopedics;  Laterality: Left;    No family history on file. Social History:  reports that he quit smoking about 5 years ago. He has never used smokeless tobacco. He reports that he does not drink alcohol or use illicit drugs.  Allergies:  Allergies  Allergen Reactions  . Contrast Media [Iodinated Diagnostic Agents] Shortness Of Breath  . Eggs Or Egg-Derived Products Swelling    Lips      No results found for this or any previous visit (from the past 48 hour(s)). Dg Hip Unilat With Pelvis 2-3 Views Left  02/15/2016  CLINICAL DATA:  Left hip dislocation. 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 The left femoral head prosthesis is dislocated posteriorly and superiorly.  Incomplete healing of the subtrochanteric fracture and lucency around the prosthesis is again demonstrated. IMPRESSION: Posterior superior dislocation of the left femoral head prosthesis. Incomplete healing of the subtrochanteric fracture and slight progression of lucency around the femoral prosthesis above the cerclage wire. Electronically Signed   By: Roy MeyerP.  Becker M.D.   On: 02/15/2016 10:12    Review of Systems  Constitutional: Positive for malaise/fatigue. Negative for fever, chills, weight loss and diaphoresis.  HENT: Negative.   Eyes: Negative.   Respiratory: Positive for shortness of breath. Negative for cough, hemoptysis, sputum production and wheezing.        SOB with exertion  Cardiovascular: Negative.   Gastrointestinal: Negative.   Genitourinary: Negative.   Musculoskeletal: Positive for myalgias and joint pain. Negative for back pain, falls and neck pain.  Skin: Negative.   Neurological: Negative.  Negative for weakness.  Endo/Heme/Allergies: Negative.   Psychiatric/Behavioral: Negative.     Blood pressure 166/88, pulse 72, temperature 97.7 F (36.5 C), temperature source Oral, resp. rate 18, weight 96.163 kg (212 lb), SpO2 96 %. Physical Exam  Constitutional: He is oriented to person, place, and time. He appears well-developed. No distress.  Obese  HENT:  Head: Normocephalic and atraumatic.  Right Ear: External ear normal.  Left Ear: External ear normal.  Nose: Nose normal.  Mouth/Throat: Oropharynx is clear and moist.  Eyes: Conjunctivae and EOM are normal.  Neck: Normal range of motion. Neck supple.  Cardiovascular: Normal rate, regular rhythm, normal heart sounds and intact distal pulses.   No murmur heard. Respiratory: Effort normal and breath sounds normal. No respiratory distress. He has no wheezes.  GI: Soft. Bowel sounds are normal. He exhibits no distension. There is no tenderness.  Musculoskeletal:       Right hip: Normal.       Left hip: He exhibits  decreased range of motion and decreased strength.       Right knee: Normal.       Left knee: Normal.       Right ankle: Normal.       Left ankle: Normal.  Left leg shortened due to dislocation. Incision healed with no signs of infection.   Neurological: He is alert and oriented to person, place, and time. He has normal strength and normal reflexes. No sensory deficit.  Skin: No rash noted. He is not diaphoretic. No erythema.  Psychiatric: He has a normal mood and affect. His behavior is normal.     Assessment/Plan Dislocation left total hip He needs a closed reduction of the left total hip. This will be done under general anesthesia and C-arm. Risks and benefits of the procedure discussed with the patient and his wife by Dr. Ranee Becker. Case discussed with Dr. Lequita Halt who feels that the fracture site is stable enough to attempt a closed reduction. Will admit for observation.   Roy Loughney LAUREN, PA-C 02/15/2016, 1:12 PM

## 2016-02-15 NOTE — ED Notes (Signed)
Wiped patient down with CHG wipes

## 2016-02-15 NOTE — Transfer of Care (Signed)
Immediate Anesthesia Transfer of Care Note  Patient: Roy MainlandBillie J Bordeau Jr.  Procedure(s) Performed: Procedure(s): CLOSED REDUCTION HIP (Left)  Patient Location: PACU  Anesthesia Type:General  Level of Consciousness: awake, alert , oriented and patient cooperative  Airway & Oxygen Therapy: Patient Spontanous Breathing and Patient connected to face mask oxygen  Post-op Assessment: Report given to RN and Post -op Vital signs reviewed and stable  Post vital signs: Reviewed and stable  Last Vitals:  Filed Vitals:   02/15/16 1200 02/15/16 1230  BP: 178/89 166/88  Pulse: 73 72  Temp:    Resp:      Complications: No apparent anesthesia complications

## 2016-02-15 NOTE — ED Notes (Signed)
Patient transported to X-ray 

## 2016-02-15 NOTE — ED Notes (Signed)
OR reports they will be ready for the Pt around 1330.

## 2016-02-16 ENCOUNTER — Encounter (HOSPITAL_COMMUNITY): Payer: Self-pay | Admitting: Orthopedic Surgery

## 2016-02-16 MED ORDER — HYDROCODONE-ACETAMINOPHEN 5-325 MG PO TABS: 1.0000 | ORAL_TABLET | ORAL | Status: AC | PRN

## 2016-02-16 MED ORDER — METHOCARBAMOL 500 MG PO TABS: 500.0000 mg | ORAL_TABLET | Freq: Four times a day (QID) | ORAL | Status: AC | PRN

## 2016-02-16 NOTE — Discharge Instructions (Signed)
Wear hip abduction brace at all times except showering and during PT Continue therapy as previously instructed Follow up on May 16 with Dr. Lequita HaltAluisio

## 2016-02-16 NOTE — Care Management Note (Signed)
Case Management Note  Patient Details  Name: Roy Becker. MRN: [id number] Date of Birth: [birth year]  Subjective/Objective:        65 yo admitted with failed total hip arthroplasty with dislocation.            Action/Plan: From home with spouse.  Expected Discharge Date:   (unknown)               Expected Discharge Plan:  Home/Self Care  In-House Referral:     Discharge planning Services  CM Consult  Post Acute Care Choice:    Choice offered to:     DME Arranged:    DME Agency:     HH Arranged:    [place] Agency:     Status of Service:  Completed, signed off  Medicare Important Message Given:    Date Medicare IM Given:    Medicare IM give by:    Date Additional Medicare IM Given:    Additional Medicare Important Message give by:     If discussed at [place] of Stay Meetings, dates discussed:    Additional Comments: This CM met with pt at bedside to discuss DC plan.  Pt states he had graduated from [facility] previously and had been doing Outpatient PT at [facility]. He states his MD told him to pick up with Outpatient PT when he discharges.  Pt states he has a walker and potty chair at home and doesn't need any additional DME.  No other CM needs communicated. Lynnell Catalan, RN 02/16/2016, 10:21 AM [phone]

## 2016-02-16 NOTE — Progress Notes (Signed)
   Subjective: 1 Day Post-Op Procedure(s) (LRB): CLOSED REDUCTION HIP (Left) Patient reports pain as mild.   Patient seen in rounds with Dr. Lequita HaltAluisio. Patient is well, and has had no acute complaints or problems. No issues overnight. Reports that he is comfortable. Able to get up and go to the restroom with ease.   Objective: Vital signs in last 24 hours: Temp:  [97.7 F (36.5 C)-98.8 F (37.1 C)] 98.2 F (36.8 C) (04/21 0556) Pulse Rate:  [66-78] 70 (04/21 0556) Resp:  [12-20] 16 (04/21 0556) BP: (138-181)/(61-91) 160/76 mmHg (04/21 0556) SpO2:  [96 %-100 %] 98 % (04/21 0556) Weight:  [96.163 kg (212 lb)] 96.163 kg (212 lb) (04/20 0940)  Intake/Output from previous day:  Intake/Output Summary (Last 24 hours) at 02/16/16 0731 Last data filed at 02/16/16 0557  Gross per 24 hour  Intake    590 ml  Output    575 ml  Net     15 ml     Labs:  Recent Labs  02/15/16 1634  HGB 13.6    Recent Labs  02/15/16 1634  WBC 7.3  RBC 4.65  HCT 40.0  PLT 168    Recent Labs  02/15/16 1634  CREATININE 0.72    EXAM General - Patient is Alert and Oriented Extremity - Neurologically intact Intact pulses distally Dorsiflexion/Plantar flexion intact Compartment soft Dressing/Incision - clean, dry, no drainage Motor Function - intact, moving foot and toes well on exam.  Knne immobilizer on  Past Medical History  Diagnosis Date  . Kidney stone   . Diverticulitis   . Arthritis     Assessment/Plan: 1 Day Post-Op Procedure(s) (LRB): CLOSED REDUCTION HIP (Left) Active Problems:   Failed total hip arthroplasty with dislocation (HCC)  Estimated body mass index is 31.29 kg/(m^2) as calculated from the following:   Height as of 12/20/15: 5\' 9"  (1.753 m).   Weight as of this encounter: 96.163 kg (212 lb). Advance diet Up with therapy D/C IV fluids Discharge home   DVT Prophylaxis - Aspirin Weight-Bearing as tolerated   He is doing well. Will pick back up with therapy  Monday. Will have [employer] measure patient for hip abduction brace.   Dimitri PedAmber Andee Chivers, PA-C Orthopaedic Surgery 02/16/2016, 7:31 AM

## 2016-02-19 NOTE — Discharge Summary (Signed)
Physician Discharge Summary   Patient ID: Roy Becker. MRN: [id number] DOB/AGE: July 02, 1951 65 y.o.  Admit date: 02/15/2016 Discharge date: 02/16/2016  Primary Diagnosis: Dislocated left total hip   Admission Diagnoses:  Past Medical History  Diagnosis Date  . Kidney stone   . Diverticulitis   . 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   Weight as of this encounter: 96.163 kg (212 lb).  Procedure:  Procedure(s) (LRB): CLOSED REDUCTION HIP (Left)   Consults: None  HPI: The patient presented with left hip pain this morning. He is present with his wife in the ED. He reports that he was getting out of his recliner at home this morning and felt some pain and "something slip" in his left hip. He has not been able to ambulate as a result of the left hip pain. He feels that the left leg is shorter. No falls. He is about 8 weeks post op from a left hip acetabular revision by Dr. Wynelle Link. He did have a periprosthetic fracture around the femoral component which was cabled and secured during that operation. He has been progressing well since his surgery. Has not eaten since midnight last night.   Laboratory Data: Admission on 02/15/2016, Discharged on 02/16/2016  Component Date Value Ref Range Status  . WBC 02/15/2016 7.3  4.0 - 10.5 K/uL Final  . RBC 02/15/2016 4.65  4.22 - 5.81 MIL/uL Final  . Hemoglobin 02/15/2016 13.6  13.0 - 17.0 g/dL Final  . HCT 02/15/2016 40.0  39.0 - 52.0 % Final  . MCV 02/15/2016 86.0  78.0 - 100.0 fL Final  . MCH 02/15/2016 29.2  26.0 - 34.0 pg Final  . MCHC 02/15/2016 34.0  30.0 - 36.0 g/dL Final  . RDW 02/15/2016 13.2  11.5 - 15.5 % Final  . Platelets 02/15/2016 168  150 - 400 K/uL Final  . Creatinine, Ser 02/15/2016 0.72  0.61 - 1.24 mg/dL Final  . GFR calc non  Af Amer 02/15/2016 >60  >60 mL/min Final  . GFR calc Af Amer 02/15/2016 >60  >60 mL/min Final   Comment: (NOTE) The eGFR has been calculated using the CKD EPI equation. This calculation has not been validated in all clinical situations. eGFR's persistently <60 mL/min signify possible Chronic Kidney Disease.      X-Rays:Dg C-arm 1-60 Min-no Report  02/15/2016  CLINICAL DATA: surg C-ARM 1-60 MINUTES Fluoroscopy was utilized by the requesting physician.  No radiographic interpretation.   Dg Hip Unilat With Pelvis 1v Left  02/15/2016  CLINICAL DATA:  Closed reduction left hip. EXAM: DG C-ARM 1-60 MIN-NO REPORT; DG HIP (WITH OR WITHOUT PELVIS) 1V*L* COMPARISON:  Radiography from earlier today FINDINGS: Fluoroscopic image of a left hip total arthroplasty shows interval location of the prosthetic joint. No visualized periprosthetic fracture. IMPRESSION: Located left hip prosthesis. Electronically Signed   By: Monte Fantasia M.D.   On: 02/15/2016 14:57   Dg Hip Unilat With Pelvis 2-3 Views Left  02/15/2016  CLINICAL DATA:  Left hip dislocation. 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 The left femoral head prosthesis is dislocated posteriorly and superiorly. Incomplete healing of the subtrochanteric fracture and lucency around the prosthesis is again demonstrated. IMPRESSION: Posterior superior dislocation of the  left femoral head prosthesis. Incomplete healing of the subtrochanteric fracture and slight progression of lucency around the femoral prosthesis above the cerclage wire. Electronically Signed   By: Marijo Sanes M.D.   On: 02/15/2016 10:12     Hospital Course: Roy Reicher Ival Pacer. is a 65 y.o. who was admitted to [facility]. They were brought to the operating room on 02/15/2016 and underwent Procedure(s): [place].  Patient tolerated the procedure well and was later transferred to the  recovery room and then to the orthopaedic floor for postoperative care.  They were given PO and IV analgesics for pain control following their surgery.  They were given 24 hours of postoperative antibiotics of  Anti-infectives    None     and started on DVT prophylaxis in the form of Aspirin.   Discharge planning consulted to help with postop disposition and equipment needs.  Patient had a good night on the evening of surgery.  They started to get up OOB with therapy on day one. [employer] saw and measured the patient for a hip abduction brace.  Patient was seen in rounds and was ready to go home.   Diet: Cardiac diet Activity:WBAT Follow-up:in 2 weeks Disposition - Home Discharged Condition: stable   Discharge Instructions    Call MD / Call 911    Complete by:  As directed   If you experience chest pain or shortness of breath, CALL 911 and be transported to the hospital emergency room.  If you develope a fever above 101 F, pus (white drainage) or increased drainage or redness at the wound, or calf pain, call your surgeon's office.     Constipation Prevention    Complete by:  As directed   Drink plenty of fluids.  Prune juice may be helpful.  You may use a stool softener, such as Colace (over the counter) 100 mg twice a day.  Use MiraLax (over the counter) for constipation as needed.     Diet - low sodium heart healthy    Complete by:  As directed      Discharge instructions    Complete by:  As directed   Wear hip abduction brace at all times except showering and during PT Continue therapy as previously instructed Follow up on May 16 with Dr. Wynelle Link     Increase activity slowly as tolerated    Complete by:  As directed             Medication List    STOP taking these medications        oxyCODONE 5 MG immediate release tablet  Commonly known as:  Oxy IR/ROXICODONE     rivaroxaban 10 MG Tabs tablet  Commonly known as:  XARELTO      TAKE these medications         amitriptyline 10 MG tablet  Commonly known as:  ELAVIL  Take 10 mg by mouth daily as needed for sleep (stomach irritation).     aspirin EC 81 MG tablet  Take 81 mg by mouth daily.     buPROPion 300 MG 24 hr tablet  Commonly known as:  WELLBUTRIN XL  Take 300 mg by mouth daily.     carvedilol 25 MG tablet  Commonly known as:  COREG  Take 25 mg by mouth 2 (two) times daily with a meal.     COSENTYX SENSOREADY PEN 150 MG/ML Soaj  Generic drug:  Secukinumab  Inject 150 mg into the skin every 28 (  twenty-eight) days.     folic acid 1 MG tablet  Commonly known as:  FOLVITE  Take 1 mg by mouth daily.     HYDROcodone-acetaminophen 5-325 MG tablet  Commonly known as:  NORCO/VICODIN  Take 1-2 tablets by mouth every 4 (four) hours as needed (breakthrough pain).     meloxicam 15 MG tablet  Commonly known as:  MOBIC  Take 15 mg by mouth daily as needed for pain.     methocarbamol 500 MG tablet  Commonly known as:  ROBAXIN  Take 1 tablet (500 mg total) by mouth every 6 (six) hours as needed for muscle spasms.     temazepam 15 MG capsule  Commonly known as:  RESTORIL  Take 15-30 mg by mouth at bedtime as needed for sleep.     traMADol 50 MG tablet  Commonly known as:  ULTRAM  Take 1-2 tablets (50-100 mg total) by mouth every 6 (six) hours as needed (mild pain).           Follow-up Information    Follow up with Gearlean Alf, MD. Schedule an appointment as soon as possible for a visit on 03/12/2016.   Specialty:  Orthopedic Surgery   Contact information:   [address] [place] [address] [phone]       Signed: Ardeen Jourdain, PA-C Orthopaedic Surgery 02/19/2016, 8:34 AM

## 2016-02-21 DIAGNOSIS — M25552 Pain in left hip: Secondary | ICD-10-CM | POA: Diagnosis not present

## 2016-02-23 DIAGNOSIS — M25552 Pain in left hip: Secondary | ICD-10-CM | POA: Diagnosis not present

## 2016-02-28 DIAGNOSIS — R310 Gross hematuria: Secondary | ICD-10-CM | POA: Diagnosis not present

## 2016-02-28 DIAGNOSIS — R351 Nocturia: Secondary | ICD-10-CM | POA: Diagnosis not present

## 2016-02-28 DIAGNOSIS — Z0000 Encounter for general adult medical examination without abnormal findings: Secondary | ICD-10-CM | POA: Diagnosis not present

## 2016-03-11 DIAGNOSIS — J209 Acute bronchitis, unspecified: Secondary | ICD-10-CM | POA: Diagnosis not present

## 2016-03-18 DIAGNOSIS — F324 Major depressive disorder, single episode, in partial remission: Secondary | ICD-10-CM | POA: Diagnosis not present

## 2016-03-18 DIAGNOSIS — G479 Sleep disorder, unspecified: Secondary | ICD-10-CM | POA: Diagnosis not present

## 2016-03-18 DIAGNOSIS — R05 Cough: Secondary | ICD-10-CM | POA: Diagnosis not present

## 2016-03-18 DIAGNOSIS — M545 Low back pain: Secondary | ICD-10-CM | POA: Diagnosis not present

## 2016-03-19 DIAGNOSIS — D225 Melanocytic nevi of trunk: Secondary | ICD-10-CM | POA: Diagnosis not present

## 2016-03-19 DIAGNOSIS — Z79899 Other long term (current) drug therapy: Secondary | ICD-10-CM | POA: Diagnosis not present

## 2016-03-19 DIAGNOSIS — L400 Psoriasis vulgaris: Secondary | ICD-10-CM | POA: Diagnosis not present

## 2016-03-21 DIAGNOSIS — N200 Calculus of kidney: Secondary | ICD-10-CM | POA: Diagnosis not present

## 2016-03-21 DIAGNOSIS — Z0000 Encounter for general adult medical examination without abnormal findings: Secondary | ICD-10-CM | POA: Diagnosis not present

## 2016-03-21 DIAGNOSIS — R310 Gross hematuria: Secondary | ICD-10-CM | POA: Diagnosis not present

## 2016-04-11 DIAGNOSIS — Z471 Aftercare following joint replacement surgery: Secondary | ICD-10-CM | POA: Diagnosis not present

## 2016-04-11 DIAGNOSIS — T84029D Dislocation of unspecified internal joint prosthesis, subsequent encounter: Secondary | ICD-10-CM | POA: Diagnosis not present

## 2016-05-10 DIAGNOSIS — T84029D Dislocation of unspecified internal joint prosthesis, subsequent encounter: Secondary | ICD-10-CM | POA: Diagnosis not present

## 2016-05-10 DIAGNOSIS — Z96642 Presence of left artificial hip joint: Secondary | ICD-10-CM | POA: Diagnosis not present

## 2016-05-10 DIAGNOSIS — Z471 Aftercare following joint replacement surgery: Secondary | ICD-10-CM | POA: Diagnosis not present

## 2016-05-17 DIAGNOSIS — L409 Psoriasis, unspecified: Secondary | ICD-10-CM | POA: Diagnosis not present

## 2016-05-17 DIAGNOSIS — M150 Primary generalized (osteo)arthritis: Secondary | ICD-10-CM | POA: Diagnosis not present

## 2016-05-17 DIAGNOSIS — M5136 Other intervertebral disc degeneration, lumbar region: Secondary | ICD-10-CM | POA: Diagnosis not present

## 2016-08-15 DIAGNOSIS — F430 Acute stress reaction: Secondary | ICD-10-CM | POA: Diagnosis not present

## 2016-08-16 DIAGNOSIS — L409 Psoriasis, unspecified: Secondary | ICD-10-CM | POA: Diagnosis not present

## 2016-08-16 DIAGNOSIS — M150 Primary generalized (osteo)arthritis: Secondary | ICD-10-CM | POA: Diagnosis not present

## 2016-08-16 DIAGNOSIS — M5136 Other intervertebral disc degeneration, lumbar region: Secondary | ICD-10-CM | POA: Diagnosis not present

## 2016-09-03 DIAGNOSIS — G479 Sleep disorder, unspecified: Secondary | ICD-10-CM | POA: Diagnosis not present

## 2016-09-03 DIAGNOSIS — F430 Acute stress reaction: Secondary | ICD-10-CM | POA: Diagnosis not present

## 2016-09-03 DIAGNOSIS — Z0000 Encounter for general adult medical examination without abnormal findings: Secondary | ICD-10-CM | POA: Diagnosis not present

## 2016-09-03 DIAGNOSIS — E041 Nontoxic single thyroid nodule: Secondary | ICD-10-CM | POA: Diagnosis not present

## 2016-09-03 DIAGNOSIS — K5792 Diverticulitis of intestine, part unspecified, without perforation or abscess without bleeding: Secondary | ICD-10-CM | POA: Diagnosis not present

## 2016-11-04 DIAGNOSIS — Z471 Aftercare following joint replacement surgery: Secondary | ICD-10-CM | POA: Diagnosis not present

## 2016-11-04 DIAGNOSIS — Z96642 Presence of left artificial hip joint: Secondary | ICD-10-CM | POA: Diagnosis not present

## 2016-11-08 DIAGNOSIS — L400 Psoriasis vulgaris: Secondary | ICD-10-CM | POA: Diagnosis not present

## 2016-11-08 DIAGNOSIS — Z79899 Other long term (current) drug therapy: Secondary | ICD-10-CM | POA: Diagnosis not present

## 2016-11-22 DIAGNOSIS — T84029D Dislocation of unspecified internal joint prosthesis, subsequent encounter: Secondary | ICD-10-CM | POA: Diagnosis not present

## 2016-11-22 DIAGNOSIS — Z471 Aftercare following joint replacement surgery: Secondary | ICD-10-CM | POA: Diagnosis not present

## 2016-11-22 DIAGNOSIS — Z96642 Presence of left artificial hip joint: Secondary | ICD-10-CM | POA: Diagnosis not present

## 2016-11-24 IMAGING — CR DG HIP (WITH OR WITHOUT PELVIS) 2-3V*L*
4 series · 4 of 4 positions shown · non-contrast
Comparison: 12/20/2015

CLINICAL DATA: Left hip dislocation.

EXAM:
DG HIP (WITH OR WITHOUT PELVIS) 2-3V LEFT

[x pelvis (1 of 2)]
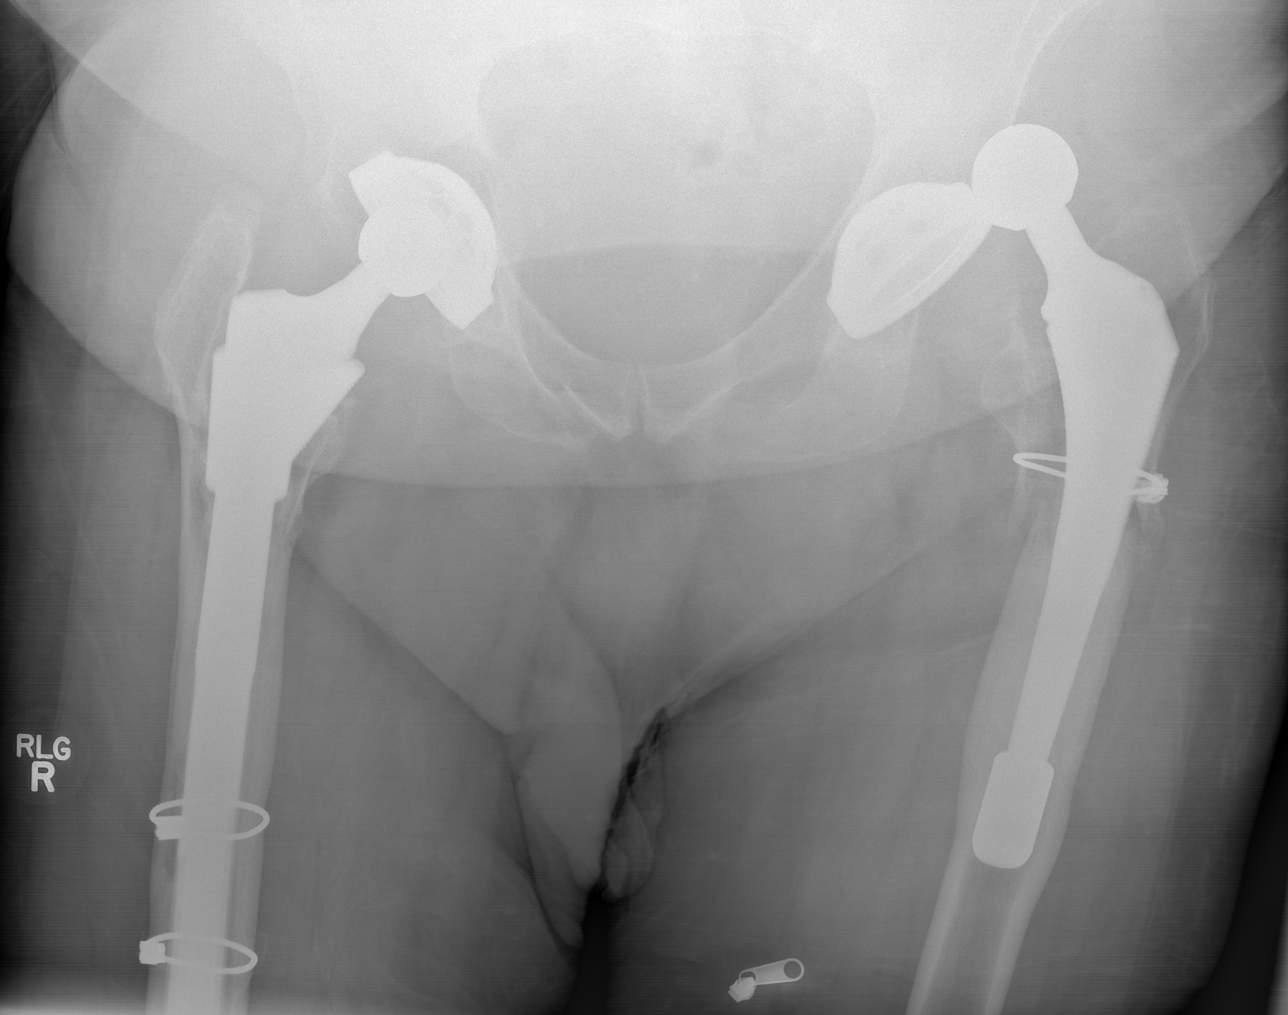

[x pelvis (2 of 2)]
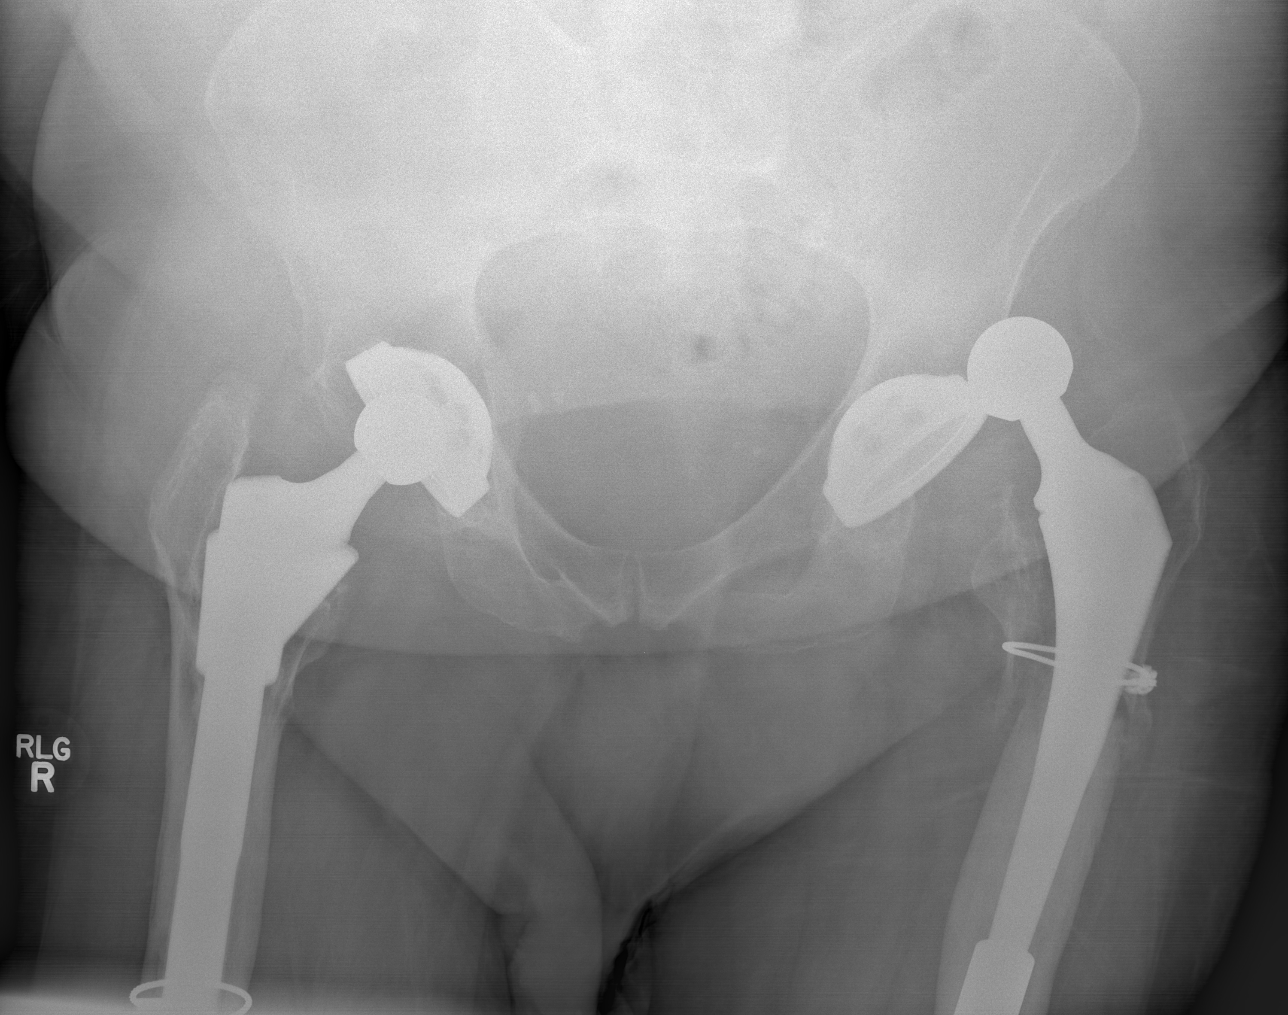

[x hip ap left]
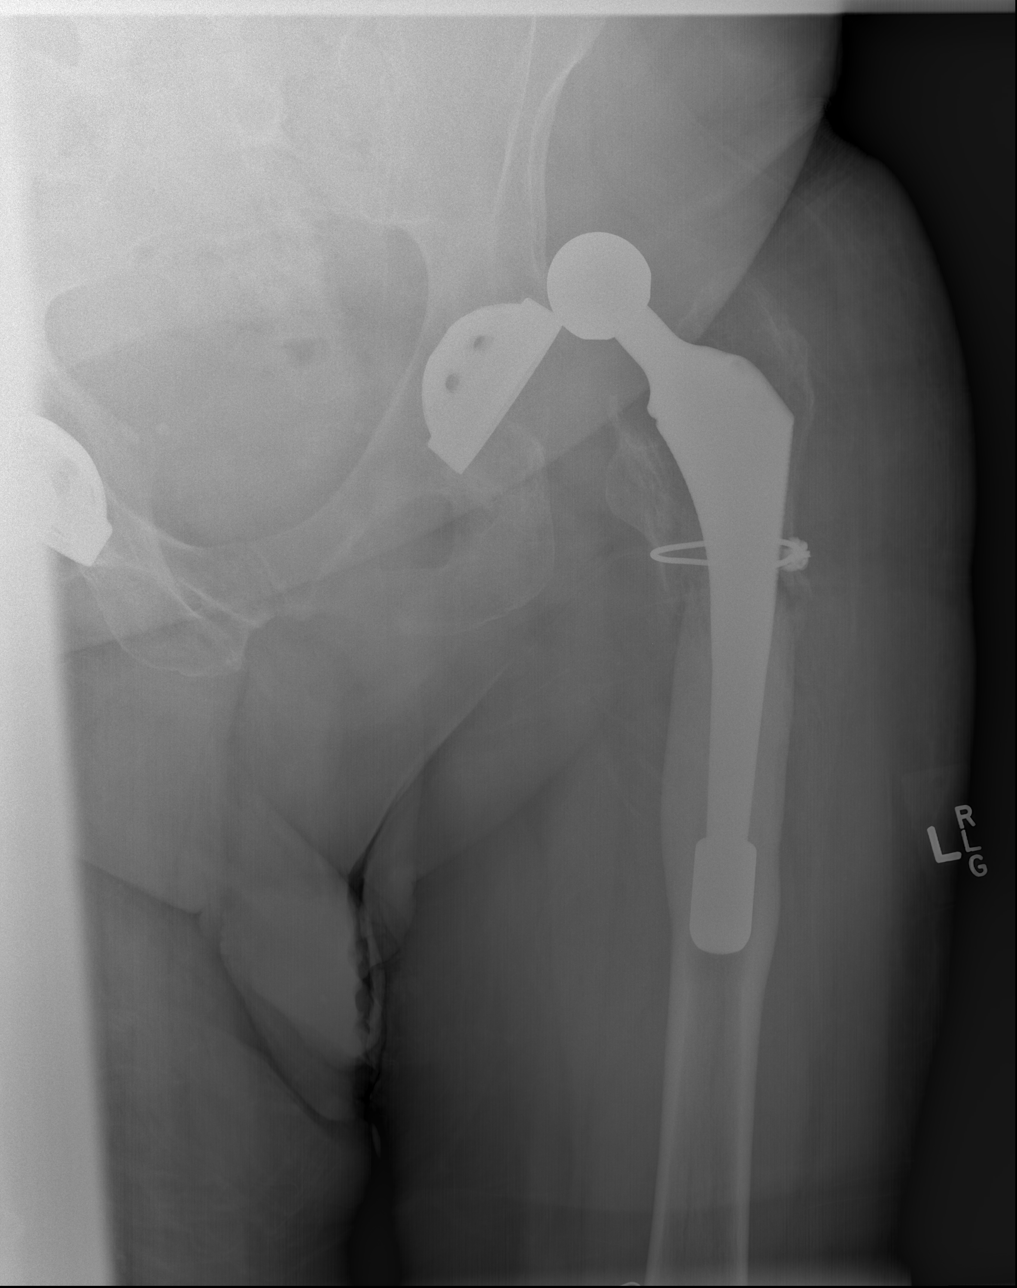

[w hip lat left]
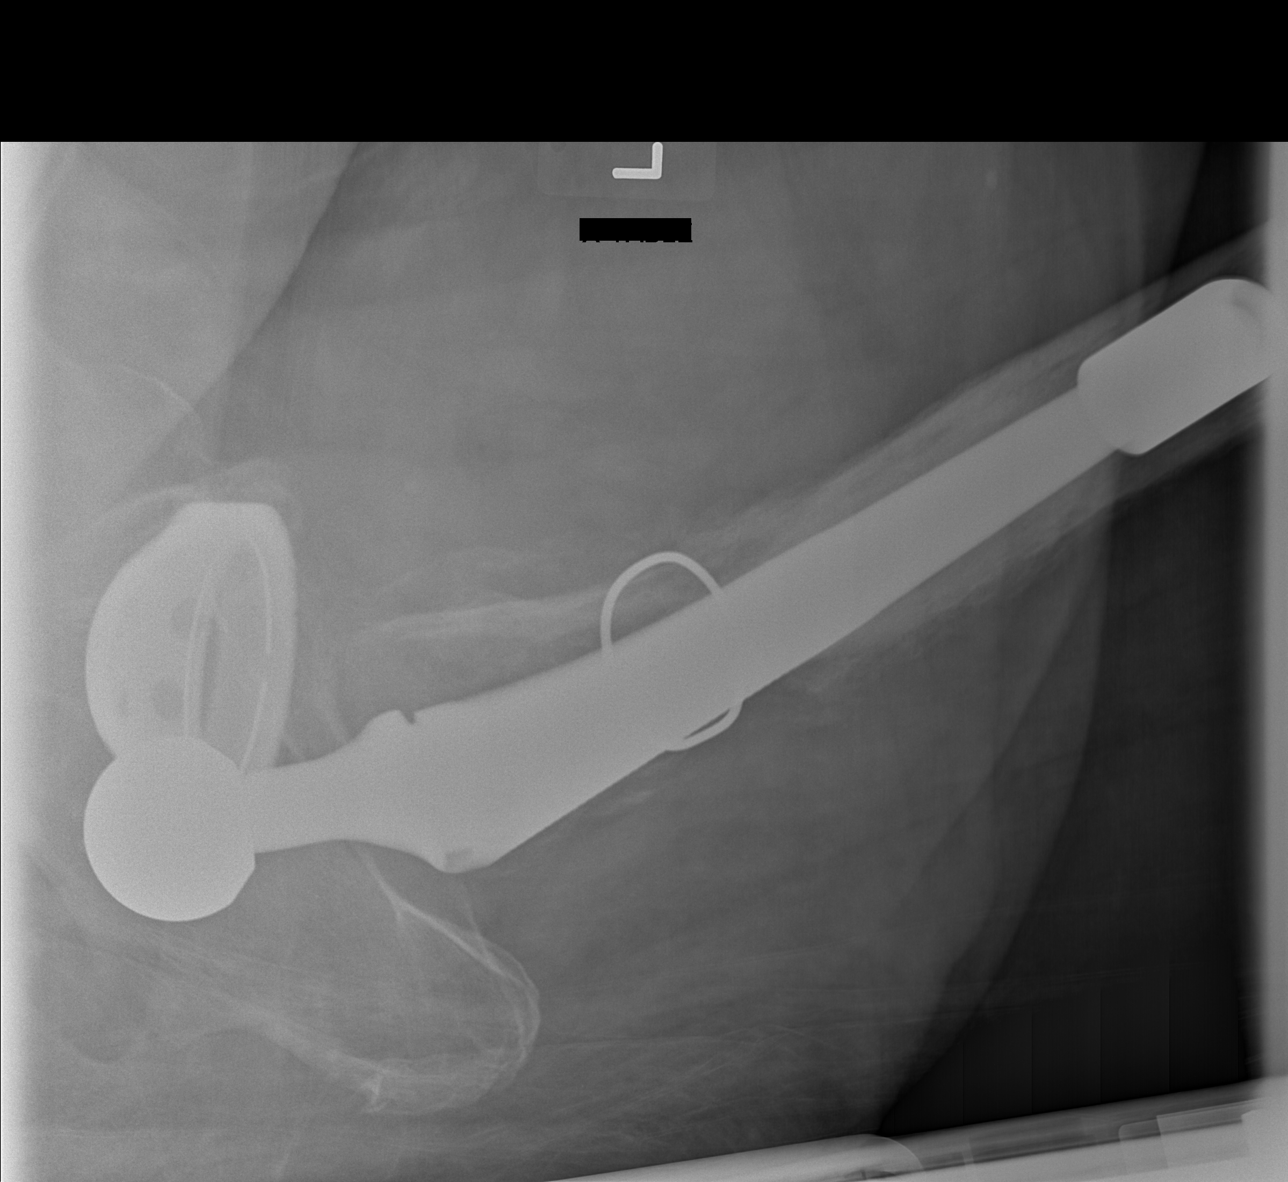

[4 of 4 positions shown; findings below may reference images not displayed]

FINDINGS: There are bilateral total hip arthroplasties. The left femoral head
prosthesis is dislocated posteriorly and superiorly. Incomplete
healing of the subtrochanteric fracture and lucency around the
prosthesis is again demonstrated.
IMPRESSION: Posterior superior dislocation of the left femoral head prosthesis.

Incomplete healing of the subtrochanteric fracture and slight
progression of lucency around the femoral prosthesis above the
cerclage wire.

## 2017-02-14 DIAGNOSIS — L409 Psoriasis, unspecified: Secondary | ICD-10-CM | POA: Diagnosis not present

## 2017-02-14 DIAGNOSIS — M5136 Other intervertebral disc degeneration, lumbar region: Secondary | ICD-10-CM | POA: Diagnosis not present

## 2017-02-14 DIAGNOSIS — M150 Primary generalized (osteo)arthritis: Secondary | ICD-10-CM | POA: Diagnosis not present

## 2017-02-28 DIAGNOSIS — G479 Sleep disorder, unspecified: Secondary | ICD-10-CM | POA: Diagnosis not present

## 2017-06-06 DIAGNOSIS — Z79899 Other long term (current) drug therapy: Secondary | ICD-10-CM | POA: Diagnosis not present

## 2017-06-06 DIAGNOSIS — L400 Psoriasis vulgaris: Secondary | ICD-10-CM | POA: Diagnosis not present

## 2017-06-06 DIAGNOSIS — D225 Melanocytic nevi of trunk: Secondary | ICD-10-CM | POA: Diagnosis not present

## 2017-08-15 DIAGNOSIS — M5136 Other intervertebral disc degeneration, lumbar region: Secondary | ICD-10-CM | POA: Diagnosis not present

## 2017-08-15 DIAGNOSIS — L409 Psoriasis, unspecified: Secondary | ICD-10-CM | POA: Diagnosis not present

## 2017-08-15 DIAGNOSIS — M150 Primary generalized (osteo)arthritis: Secondary | ICD-10-CM | POA: Diagnosis not present

## 2017-09-05 DIAGNOSIS — Z0000 Encounter for general adult medical examination without abnormal findings: Secondary | ICD-10-CM | POA: Diagnosis not present

## 2017-09-05 DIAGNOSIS — F324 Major depressive disorder, single episode, in partial remission: Secondary | ICD-10-CM | POA: Diagnosis not present

## 2017-09-05 DIAGNOSIS — E041 Nontoxic single thyroid nodule: Secondary | ICD-10-CM | POA: Diagnosis not present

## 2017-09-05 DIAGNOSIS — R259 Unspecified abnormal involuntary movements: Secondary | ICD-10-CM | POA: Diagnosis not present

## 2017-09-05 DIAGNOSIS — G479 Sleep disorder, unspecified: Secondary | ICD-10-CM | POA: Diagnosis not present

## 2017-09-05 DIAGNOSIS — F430 Acute stress reaction: Secondary | ICD-10-CM | POA: Diagnosis not present

## 2017-09-08 ENCOUNTER — Other Ambulatory Visit: Payer: Self-pay | Admitting: Family Medicine

## 2017-09-08 DIAGNOSIS — E041 Nontoxic single thyroid nodule: Secondary | ICD-10-CM

## 2017-10-03 ENCOUNTER — Other Ambulatory Visit: Payer: Federal, State, Local not specified - PPO

## 2017-10-31 DIAGNOSIS — L400 Psoriasis vulgaris: Secondary | ICD-10-CM | POA: Diagnosis not present

## 2017-10-31 DIAGNOSIS — Z79899 Other long term (current) drug therapy: Secondary | ICD-10-CM | POA: Diagnosis not present

## 2017-10-31 DIAGNOSIS — L408 Other psoriasis: Secondary | ICD-10-CM | POA: Diagnosis not present

## 2017-11-07 ENCOUNTER — Ambulatory Visit
Admission: RE | Admit: 2017-11-07 | Discharge: 2017-11-07 | Disposition: A | Discharge status: Home / Self Care | Payer: Federal, State, Local not specified - PPO | Source: Ambulatory Visit | Attending: Family Medicine | Admitting: Family Medicine

## 2017-11-07 DIAGNOSIS — E041 Nontoxic single thyroid nodule: Secondary | ICD-10-CM | POA: Diagnosis not present

## 2017-11-21 ENCOUNTER — Other Ambulatory Visit: Payer: Self-pay | Admitting: Family Medicine

## 2017-11-21 DIAGNOSIS — E041 Nontoxic single thyroid nodule: Secondary | ICD-10-CM

## 2017-12-03 ENCOUNTER — Ambulatory Visit
Admission: RE | Admit: 2017-12-03 | Discharge: 2017-12-03 | Disposition: A | Discharge status: Home / Self Care | Payer: Federal, State, Local not specified - PPO | Source: Ambulatory Visit | Attending: Family Medicine | Admitting: Family Medicine

## 2017-12-03 ENCOUNTER — Other Ambulatory Visit (HOSPITAL_COMMUNITY)
Admission: RE | Admit: 2017-12-03 | Discharge: 2017-12-03 | Disposition: A | Discharge status: Home / Self Care | Payer: Federal, State, Local not specified - PPO | Source: Ambulatory Visit | Attending: Student | Admitting: Student

## 2017-12-03 DIAGNOSIS — E041 Nontoxic single thyroid nodule: Secondary | ICD-10-CM | POA: Insufficient documentation

## 2017-12-31 DIAGNOSIS — R05 Cough: Secondary | ICD-10-CM | POA: Diagnosis not present

## 2017-12-31 DIAGNOSIS — R6889 Other general symptoms and signs: Secondary | ICD-10-CM | POA: Diagnosis not present

## 2018-02-27 DIAGNOSIS — L409 Psoriasis, unspecified: Secondary | ICD-10-CM | POA: Diagnosis not present

## 2018-02-27 DIAGNOSIS — M150 Primary generalized (osteo)arthritis: Secondary | ICD-10-CM | POA: Diagnosis not present

## 2018-02-27 DIAGNOSIS — M5136 Other intervertebral disc degeneration, lumbar region: Secondary | ICD-10-CM | POA: Diagnosis not present

## 2018-03-09 DIAGNOSIS — L408 Other psoriasis: Secondary | ICD-10-CM | POA: Diagnosis not present

## 2018-03-13 DIAGNOSIS — G479 Sleep disorder, unspecified: Secondary | ICD-10-CM | POA: Diagnosis not present

## 2018-03-13 DIAGNOSIS — R7309 Other abnormal glucose: Secondary | ICD-10-CM | POA: Diagnosis not present

## 2018-07-13 DIAGNOSIS — L608 Other nail disorders: Secondary | ICD-10-CM | POA: Diagnosis not present

## 2018-07-13 DIAGNOSIS — L408 Other psoriasis: Secondary | ICD-10-CM | POA: Diagnosis not present

## 2018-07-13 DIAGNOSIS — L0109 Other impetigo: Secondary | ICD-10-CM | POA: Diagnosis not present

## 2018-09-04 DIAGNOSIS — M5136 Other intervertebral disc degeneration, lumbar region: Secondary | ICD-10-CM | POA: Diagnosis not present

## 2018-09-04 DIAGNOSIS — L409 Psoriasis, unspecified: Secondary | ICD-10-CM | POA: Diagnosis not present

## 2018-09-04 DIAGNOSIS — M150 Primary generalized (osteo)arthritis: Secondary | ICD-10-CM | POA: Diagnosis not present

## 2018-10-08 DIAGNOSIS — Z0000 Encounter for general adult medical examination without abnormal findings: Secondary | ICD-10-CM | POA: Diagnosis not present

## 2018-10-08 DIAGNOSIS — R7309 Other abnormal glucose: Secondary | ICD-10-CM | POA: Diagnosis not present

## 2018-10-08 DIAGNOSIS — E041 Nontoxic single thyroid nodule: Secondary | ICD-10-CM | POA: Diagnosis not present

## 2018-10-09 DIAGNOSIS — G479 Sleep disorder, unspecified: Secondary | ICD-10-CM | POA: Diagnosis not present

## 2018-10-09 DIAGNOSIS — Z0000 Encounter for general adult medical examination without abnormal findings: Secondary | ICD-10-CM | POA: Diagnosis not present

## 2018-10-09 DIAGNOSIS — I10 Essential (primary) hypertension: Secondary | ICD-10-CM | POA: Diagnosis not present

## 2018-10-09 DIAGNOSIS — Z23 Encounter for immunization: Secondary | ICD-10-CM | POA: Diagnosis not present

## 2018-10-09 DIAGNOSIS — F324 Major depressive disorder, single episode, in partial remission: Secondary | ICD-10-CM | POA: Diagnosis not present

## 2018-10-09 DIAGNOSIS — E1169 Type 2 diabetes mellitus with other specified complication: Secondary | ICD-10-CM | POA: Diagnosis not present

## 2018-10-16 DIAGNOSIS — Z79899 Other long term (current) drug therapy: Secondary | ICD-10-CM | POA: Diagnosis not present

## 2018-10-16 DIAGNOSIS — L408 Other psoriasis: Secondary | ICD-10-CM | POA: Diagnosis not present

## 2018-10-16 DIAGNOSIS — L608 Other nail disorders: Secondary | ICD-10-CM | POA: Diagnosis not present

## 2018-10-30 DIAGNOSIS — M25551 Pain in right hip: Secondary | ICD-10-CM | POA: Diagnosis not present

## 2018-10-30 DIAGNOSIS — Z96642 Presence of left artificial hip joint: Secondary | ICD-10-CM | POA: Diagnosis not present

## 2018-10-30 DIAGNOSIS — M545 Low back pain: Secondary | ICD-10-CM | POA: Diagnosis not present

## 2018-11-23 DIAGNOSIS — J069 Acute upper respiratory infection, unspecified: Secondary | ICD-10-CM | POA: Diagnosis not present

## 2018-11-23 DIAGNOSIS — J028 Acute pharyngitis due to other specified organisms: Secondary | ICD-10-CM | POA: Diagnosis not present

## 2018-11-23 DIAGNOSIS — B9789 Other viral agents as the cause of diseases classified elsewhere: Secondary | ICD-10-CM | POA: Diagnosis not present

## 2018-11-23 DIAGNOSIS — J019 Acute sinusitis, unspecified: Secondary | ICD-10-CM | POA: Diagnosis not present

## 2018-12-11 ENCOUNTER — Ambulatory Visit: Payer: Federal, State, Local not specified - PPO | Admitting: *Deleted

## 2018-12-25 ENCOUNTER — Encounter: Payer: Federal, State, Local not specified - PPO | Attending: Family Medicine | Admitting: *Deleted

## 2018-12-25 DIAGNOSIS — E119 Type 2 diabetes mellitus without complications: Secondary | ICD-10-CM | POA: Diagnosis not present

## 2018-12-25 NOTE — Patient Instructions (Signed)
Plan:  Aim for 3 Carb Choices per meal (45 grams) +/- 1 either way  Aim for 0-2 Carbs per snack if hungry  Include protein in moderation with your meals and snacks Consider reading food labels for Total Carbohydrate of foods Continue with your activity level daily as tolerated Consider checking BG at alternate times per day      

## 2018-12-30 NOTE — Progress Notes (Signed)
Diabetes Self-Management Education  Visit Type: First/Initial  Appt. Start Time: 0930 Appt. End Time: 1100  12/30/2018  [name], identified by name and date of birth, is a 68 y.o. male with a diagnosis of Diabetes: Type 2. Patient states he doesn't know that he had diabetes. He works full time for [employer] in [place]. He states he lost about 20 pounds last year, starting with [employer]risystem and continuing after coming off of diet. He states he has a recent problem with "drop foot" so has to be careful walking so he doesn't trip.  He would like to learn more about nutrition for Diabetes today.  ASSESSMENT  Height 5' 8.75" (1.746 m), weight 199 lb 11.2 oz (90.6 kg). Body mass index is 29.71 kg/m.  Diabetes Self-Management Education - 12/25/18 0934      Visit Information   Visit Type  First/Initial      Initial Visit   Diabetes Type  Type 2    Are you currently following a meal plan?  No    Are you taking your medications as prescribed?  Not on Medications    Date Diagnosed  not aware of diagnosis      Health Coping   How would you rate your overall health?  Fair      Psychosocial Assessment   Patient Belief/Attitude about Diabetes  Denial    Self-care barriers  None    Other persons present  Patient    Patient Concerns  Nutrition/Meal planning;Glycemic Control    Special Needs  None    Preferred Learning Style  No preference indicated    Learning Readiness  Contemplating    How often do you need to have someone help you when you read instructions, pamphlets, or other written materials from your doctor or pharmacy?  1 - Never    What is the last grade level you completed in school?  masters degree      Pre-Education Assessment   Patient understands the diabetes disease and treatment process.  Needs Instruction    Patient understands incorporating nutritional management into lifestyle.  Needs Instruction    Patient undertands incorporating physical  activity into lifestyle.  Needs Instruction    Patient understands using medications safely.  Needs Instruction    Patient understands monitoring blood glucose, interpreting and using results  Needs Instruction    Patient understands prevention, detection, and treatment of acute complications.  Needs Instruction    Patient understands prevention, detection, and treatment of chronic complications.  Needs Instruction    Patient understands how to develop strategies to address psychosocial issues.  Needs Instruction    Patient understands how to develop strategies to promote health/change behavior.  Needs Instruction      Complications   Last HgB A1C per patient/outside source  10.4 %    How often do you check your blood sugar?  0 times/day (not testing)    Have you had a dilated eye exam in the past 12 months?  Yes    Have you had a dental exam in the past 12 months?  Yes    Are you checking your feet?  Yes    How many days per week are you checking your feet?  7      Dietary Intake   Breakfast  skips often     Snack (morning)  pack of almonds    Lunch  skips most days, may have some popcorn    Dinner  pick up dinner  meal on way home from work- [employer], [employer], etc    Snack (evening)  used to eat ice cream and milkshakes, now switched to [place] yogurt blended with fruit     Beverage(s)  diet [employer], water, flavored water, unsweet tea occaisonally with Stevia added      Exercise   Exercise Type  ADL's   walks some at work, no scheduled exercise     Patient Education   Previous Diabetes Education  No    Disease state   Factors that contribute to the development of diabetes;Definition of diabetes, type 1 and 2, and the diagnosis of diabetes    Nutrition management   Role of diet in the treatment of diabetes and the relationship between the three main macronutrients and blood glucose level;Food label reading, portion sizes and measuring food.;Carbohydrate counting;Reviewed  blood glucose goals for pre and post meals and how to evaluate the patients' food intake on their blood glucose level.    Physical activity and exercise   Role of exercise on diabetes management, blood pressure control and cardiac health.    Monitoring  Purpose and frequency of SMBG.;Identified appropriate SMBG and/or A1C goals.    Chronic complications  Relationship between chronic complications and blood glucose control    Psychosocial adjustment  Role of stress on diabetes;Identified and addressed patients feelings and concerns about diabetes      Individualized Goals (developed by patient)   Nutrition  Follow meal plan discussed    Physical Activity  Exercise 3-5 times per week    Medications  take my medication as prescribed    Monitoring   test blood glucose pre and post meals as discussed      Post-Education Assessment   Patient understands the diabetes disease and treatment process.  Demonstrates understanding / competency    Patient understands incorporating nutritional management into lifestyle.  Demonstrates understanding / competency    Patient undertands incorporating physical activity into lifestyle.  Demonstrates understanding / competency    Patient understands monitoring blood glucose, interpreting and using results  Demonstrates understanding / competency    Patient understands prevention, detection, and treatment of acute complications.  Demonstrates understanding / competency    Patient understands prevention, detection, and treatment of chronic complications.  Demonstrates understanding / competency    Patient understands how to develop strategies to address psychosocial issues.  Demonstrates understanding / competency    Patient understands how to develop strategies to promote health/change behavior.  Demonstrates understanding / competency      Outcomes   Expected Outcomes  Demonstrated interest in learning. Expect positive outcomes    Future DMSE  PRN    Program  Status  Completed       Individualized Plan for Diabetes Self-Management Training:   Learning Objective:  Patient will have a greater understanding of diabetes self-management. Patient education plan is to attend individual and/or group sessions per assessed needs and concerns.   Plan:   Patient Instructions  Plan:  Aim for 3 Carb Choices per meal (45 grams) +/- 1 either way  Aim for 0-2 Carbs per snack if hungry  Include protein in moderation with your meals and snacks Consider reading food labels for Total Carbohydrate of foods Continue with your activity level daily as tolerated Consider checking BG at alternate times per day   Expected Outcomes:  Demonstrated interest in learning. Expect positive outcomes  Education material provided: Food label handouts, A1C conversion sheet, Meal plan card and Carbohydrate counting sheet  If  problems or questions, patient to contact team via:  Phone  Future DSME appointment: PRN

## 2019-01-04 DIAGNOSIS — E1169 Type 2 diabetes mellitus with other specified complication: Secondary | ICD-10-CM | POA: Diagnosis not present

## 2019-01-08 DIAGNOSIS — E1169 Type 2 diabetes mellitus with other specified complication: Secondary | ICD-10-CM | POA: Diagnosis not present

## 2019-04-09 ENCOUNTER — Other Ambulatory Visit: Payer: Self-pay | Admitting: Family Medicine

## 2019-04-09 DIAGNOSIS — E041 Nontoxic single thyroid nodule: Secondary | ICD-10-CM

## 2019-04-13 DIAGNOSIS — E1169 Type 2 diabetes mellitus with other specified complication: Secondary | ICD-10-CM | POA: Diagnosis not present

## 2019-04-16 DIAGNOSIS — I10 Essential (primary) hypertension: Secondary | ICD-10-CM | POA: Diagnosis not present

## 2019-04-16 DIAGNOSIS — E1169 Type 2 diabetes mellitus with other specified complication: Secondary | ICD-10-CM | POA: Diagnosis not present

## 2019-04-16 DIAGNOSIS — G479 Sleep disorder, unspecified: Secondary | ICD-10-CM | POA: Diagnosis not present

## 2019-04-21 DIAGNOSIS — L408 Other psoriasis: Secondary | ICD-10-CM | POA: Diagnosis not present

## 2019-04-21 DIAGNOSIS — Z79899 Other long term (current) drug therapy: Secondary | ICD-10-CM | POA: Diagnosis not present

## 2019-08-06 DIAGNOSIS — Z79899 Other long term (current) drug therapy: Secondary | ICD-10-CM | POA: Diagnosis not present

## 2019-08-24 DIAGNOSIS — M24112 Other articular cartilage disorders, left shoulder: Secondary | ICD-10-CM | POA: Diagnosis not present

## 2019-08-24 DIAGNOSIS — M5021 Other cervical disc displacement,  high cervical region: Secondary | ICD-10-CM | POA: Diagnosis not present

## 2019-08-24 DIAGNOSIS — R936 Abnormal findings on diagnostic imaging of limbs: Secondary | ICD-10-CM | POA: Diagnosis not present

## 2019-08-24 DIAGNOSIS — M47812 Spondylosis without myelopathy or radiculopathy, cervical region: Secondary | ICD-10-CM | POA: Diagnosis not present

## 2019-08-24 DIAGNOSIS — M19012 Primary osteoarthritis, left shoulder: Secondary | ICD-10-CM | POA: Diagnosis not present

## 2019-08-31 DIAGNOSIS — M150 Primary generalized (osteo)arthritis: Secondary | ICD-10-CM | POA: Diagnosis not present

## 2019-08-31 DIAGNOSIS — L409 Psoriasis, unspecified: Secondary | ICD-10-CM | POA: Diagnosis not present

## 2019-08-31 DIAGNOSIS — M5136 Other intervertebral disc degeneration, lumbar region: Secondary | ICD-10-CM | POA: Diagnosis not present

## 2019-09-10 DIAGNOSIS — M25512 Pain in left shoulder: Secondary | ICD-10-CM | POA: Diagnosis not present

## 2019-11-01 DIAGNOSIS — L400 Psoriasis vulgaris: Secondary | ICD-10-CM | POA: Diagnosis not present

## 2019-11-02 DIAGNOSIS — M25512 Pain in left shoulder: Secondary | ICD-10-CM | POA: Diagnosis not present

## 2019-11-21 ENCOUNTER — Ambulatory Visit: Payer: Federal, State, Local not specified - PPO | Attending: Internal Medicine

## 2019-11-21 DIAGNOSIS — Z23 Encounter for immunization: Secondary | ICD-10-CM | POA: Insufficient documentation

## 2019-11-21 NOTE — Progress Notes (Signed)
   [id number]  Name:  Roy Becker.    MRN: [id number] DOB: Oct 17, 1951  11/21/2019  [name] was observed post Covid-19 immunization for 30 minutes based on pre-vaccination screening without incidence. He was provided with Vaccine Information Sheet and instruction to access the V-Safe system.   [name] was instructed to call 911 with any severe reactions post vaccine: Marland Kitchen Difficulty breathing  . Swelling of your face and throat  . A fast heartbeat  . A bad rash all over your body  . Dizziness and weakness    Immunizations Administered    Name Date Dose VIS Date Route   Pfizer COVID-19 Vaccine 11/21/2019 12:52 PM 0.3 mL 10/08/2019 Intramuscular   Manufacturer: [employer], [employer]   Lot: [id number]   NDC: [address]-3

## 2019-12-13 ENCOUNTER — Ambulatory Visit: Payer: Federal, State, Local not specified - PPO | Attending: Internal Medicine

## 2019-12-13 DIAGNOSIS — Z23 Encounter for immunization: Secondary | ICD-10-CM | POA: Insufficient documentation

## 2019-12-13 NOTE — Progress Notes (Signed)
   [id number]  Name:  Roy Becker.    MRN: [id number] DOB: Jun 28, 1951  12/13/2019  Mr. Burley was observed post Covid-19 immunization for 30 minutes based on pre-vaccination screening without incidence. He was provided with Vaccine Information Sheet and instruction to access the V-Safe system.   Mr. Causby was instructed to call 911 with any severe reactions post vaccine: Marland Kitchen Difficulty breathing  . Swelling of your face and throat  . A fast heartbeat  . A bad rash all over your body  . Dizziness and weakness    Immunizations Administered    Name Date Dose VIS Date Route   Pfizer COVID-19 Vaccine 12/13/2019 11:43 AM 0.3 mL 10/08/2019 Intramuscular   Manufacturer: [employer], [employer]   Lot: [id number]   NDC: [address]-4

## 2019-12-14 DIAGNOSIS — M25512 Pain in left shoulder: Secondary | ICD-10-CM | POA: Diagnosis not present

## 2020-02-11 DIAGNOSIS — Z125 Encounter for screening for malignant neoplasm of prostate: Secondary | ICD-10-CM | POA: Diagnosis not present

## 2020-02-11 DIAGNOSIS — E041 Nontoxic single thyroid nodule: Secondary | ICD-10-CM | POA: Diagnosis not present

## 2020-02-11 DIAGNOSIS — Z0000 Encounter for general adult medical examination without abnormal findings: Secondary | ICD-10-CM | POA: Diagnosis not present

## 2020-02-11 DIAGNOSIS — E1169 Type 2 diabetes mellitus with other specified complication: Secondary | ICD-10-CM | POA: Diagnosis not present

## 2020-02-11 DIAGNOSIS — Z13220 Encounter for screening for lipoid disorders: Secondary | ICD-10-CM | POA: Diagnosis not present

## 2020-02-18 DIAGNOSIS — Z0000 Encounter for general adult medical examination without abnormal findings: Secondary | ICD-10-CM | POA: Diagnosis not present

## 2020-02-18 DIAGNOSIS — I10 Essential (primary) hypertension: Secondary | ICD-10-CM | POA: Diagnosis not present

## 2020-02-18 DIAGNOSIS — G479 Sleep disorder, unspecified: Secondary | ICD-10-CM | POA: Diagnosis not present

## 2020-02-18 DIAGNOSIS — Z23 Encounter for immunization: Secondary | ICD-10-CM | POA: Diagnosis not present

## 2020-02-18 DIAGNOSIS — E1169 Type 2 diabetes mellitus with other specified complication: Secondary | ICD-10-CM | POA: Diagnosis not present

## 2020-02-18 DIAGNOSIS — F324 Major depressive disorder, single episode, in partial remission: Secondary | ICD-10-CM | POA: Diagnosis not present

## 2020-04-21 DIAGNOSIS — Z01818 Encounter for other preprocedural examination: Secondary | ICD-10-CM | POA: Diagnosis not present

## 2020-04-21 DIAGNOSIS — Z86010 Personal history of colonic polyps: Secondary | ICD-10-CM | POA: Diagnosis not present

## 2020-08-07 DIAGNOSIS — L408 Other psoriasis: Secondary | ICD-10-CM | POA: Diagnosis not present

## 2020-08-11 DIAGNOSIS — Z79899 Other long term (current) drug therapy: Secondary | ICD-10-CM | POA: Diagnosis not present

## 2020-08-11 DIAGNOSIS — Z01812 Encounter for preprocedural laboratory examination: Secondary | ICD-10-CM | POA: Diagnosis not present

## 2020-08-11 DIAGNOSIS — L400 Psoriasis vulgaris: Secondary | ICD-10-CM | POA: Diagnosis not present

## 2020-08-16 DIAGNOSIS — K5730 Diverticulosis of large intestine without perforation or abscess without bleeding: Secondary | ICD-10-CM | POA: Diagnosis not present

## 2020-08-16 DIAGNOSIS — Z86010 Personal history of colonic polyps: Secondary | ICD-10-CM | POA: Diagnosis not present

## 2020-08-16 DIAGNOSIS — K635 Polyp of colon: Secondary | ICD-10-CM | POA: Diagnosis not present

## 2020-08-23 DIAGNOSIS — G479 Sleep disorder, unspecified: Secondary | ICD-10-CM | POA: Diagnosis not present

## 2020-08-23 DIAGNOSIS — K5792 Diverticulitis of intestine, part unspecified, without perforation or abscess without bleeding: Secondary | ICD-10-CM | POA: Diagnosis not present

## 2020-08-23 DIAGNOSIS — I10 Essential (primary) hypertension: Secondary | ICD-10-CM | POA: Diagnosis not present

## 2020-08-23 DIAGNOSIS — E1169 Type 2 diabetes mellitus with other specified complication: Secondary | ICD-10-CM | POA: Diagnosis not present

## 2020-08-30 DIAGNOSIS — M150 Primary generalized (osteo)arthritis: Secondary | ICD-10-CM | POA: Diagnosis not present

## 2020-08-30 DIAGNOSIS — M5136 Other intervertebral disc degeneration, lumbar region: Secondary | ICD-10-CM | POA: Diagnosis not present

## 2020-08-30 DIAGNOSIS — L409 Psoriasis, unspecified: Secondary | ICD-10-CM | POA: Diagnosis not present

## 2021-02-21 DIAGNOSIS — Z125 Encounter for screening for malignant neoplasm of prostate: Secondary | ICD-10-CM | POA: Diagnosis not present

## 2021-02-21 DIAGNOSIS — E1169 Type 2 diabetes mellitus with other specified complication: Secondary | ICD-10-CM | POA: Diagnosis not present

## 2021-02-21 DIAGNOSIS — R251 Tremor, unspecified: Secondary | ICD-10-CM | POA: Diagnosis not present

## 2021-02-21 DIAGNOSIS — I10 Essential (primary) hypertension: Secondary | ICD-10-CM | POA: Diagnosis not present

## 2021-02-21 DIAGNOSIS — Z0000 Encounter for general adult medical examination without abnormal findings: Secondary | ICD-10-CM | POA: Diagnosis not present

## 2021-02-21 DIAGNOSIS — Z13220 Encounter for screening for lipoid disorders: Secondary | ICD-10-CM | POA: Diagnosis not present

## 2021-02-21 DIAGNOSIS — Z23 Encounter for immunization: Secondary | ICD-10-CM | POA: Diagnosis not present

## 2021-02-21 DIAGNOSIS — F1021 Alcohol dependence, in remission: Secondary | ICD-10-CM | POA: Diagnosis not present

## 2021-04-10 DIAGNOSIS — R202 Paresthesia of skin: Secondary | ICD-10-CM | POA: Diagnosis not present

## 2021-04-10 DIAGNOSIS — M5010 Cervical disc disorder with radiculopathy, unspecified cervical region: Secondary | ICD-10-CM | POA: Diagnosis not present

## 2022-03-04 ENCOUNTER — Other Ambulatory Visit: Payer: Self-pay | Admitting: Family Medicine

## 2022-03-04 DIAGNOSIS — Z0000 Encounter for general adult medical examination without abnormal findings: Secondary | ICD-10-CM

## 2022-03-11 ENCOUNTER — Ambulatory Visit
Admission: RE | Admit: 2022-03-11 | Discharge: 2022-03-11 | Disposition: A | Discharge status: Home / Self Care | Payer: Federal, State, Local not specified - PPO | Source: Ambulatory Visit | Attending: Family Medicine | Admitting: Family Medicine

## 2022-03-11 DIAGNOSIS — Z0000 Encounter for general adult medical examination without abnormal findings: Secondary | ICD-10-CM

## 2022-10-16 ENCOUNTER — Other Ambulatory Visit: Payer: Self-pay | Admitting: Otolaryngology

## 2022-10-16 DIAGNOSIS — E041 Nontoxic single thyroid nodule: Secondary | ICD-10-CM

## 2022-11-15 ENCOUNTER — Ambulatory Visit
Admission: RE | Admit: 2022-11-15 | Discharge: 2022-11-15 | Disposition: A | Discharge status: Home / Self Care | Payer: Medicare Other | Source: Ambulatory Visit | Attending: Otolaryngology | Admitting: Otolaryngology

## 2022-11-15 DIAGNOSIS — E041 Nontoxic single thyroid nodule: Secondary | ICD-10-CM

## 2024-11-30 ENCOUNTER — Ambulatory Visit: Payer: Self-pay | Admitting: Orthopedic Surgery

## 2024-11-30 DIAGNOSIS — M48062 Spinal stenosis, lumbar region with neurogenic claudication: Secondary | ICD-10-CM

## 2025-01-13 ENCOUNTER — Ambulatory Visit (HOSPITAL_COMMUNITY): Admit: 2025-01-13 | Admitting: Specialist
